# Patient Record
Sex: Female | Born: 1955
Health system: Southern US, Community
[De-identification: ages and names within clinical notes are randomized; demographics above are authoritative.]

## PROBLEM LIST (undated history)

## (undated) DIAGNOSIS — R002 Palpitations: Secondary | ICD-10-CM

## (undated) DIAGNOSIS — R079 Chest pain, unspecified: Secondary | ICD-10-CM

## (undated) DIAGNOSIS — J45909 Unspecified asthma, uncomplicated: Secondary | ICD-10-CM

## (undated) DIAGNOSIS — G4733 Obstructive sleep apnea (adult) (pediatric): Secondary | ICD-10-CM

## (undated) DIAGNOSIS — E559 Vitamin D deficiency, unspecified: Secondary | ICD-10-CM

## (undated) DIAGNOSIS — I219 Acute myocardial infarction, unspecified: Secondary | ICD-10-CM

## (undated) DIAGNOSIS — K5900 Constipation, unspecified: Secondary | ICD-10-CM

## (undated) DIAGNOSIS — F32A Depression, unspecified: Secondary | ICD-10-CM

## (undated) DIAGNOSIS — K219 Gastro-esophageal reflux disease without esophagitis: Secondary | ICD-10-CM

## (undated) DIAGNOSIS — N289 Disorder of kidney and ureter, unspecified: Secondary | ICD-10-CM

## (undated) DIAGNOSIS — E7800 Pure hypercholesterolemia, unspecified: Secondary | ICD-10-CM

## (undated) DIAGNOSIS — E2839 Other primary ovarian failure: Secondary | ICD-10-CM

## (undated) DIAGNOSIS — I2510 Atherosclerotic heart disease of native coronary artery without angina pectoris: Secondary | ICD-10-CM

## (undated) DIAGNOSIS — F419 Anxiety disorder, unspecified: Secondary | ICD-10-CM

## (undated) DIAGNOSIS — E119 Type 2 diabetes mellitus without complications: Secondary | ICD-10-CM

## (undated) DIAGNOSIS — D649 Anemia, unspecified: Secondary | ICD-10-CM

## (undated) DIAGNOSIS — I10 Essential (primary) hypertension: Secondary | ICD-10-CM

## (undated) DIAGNOSIS — K760 Fatty (change of) liver, not elsewhere classified: Secondary | ICD-10-CM

## (undated) HISTORY — PX: BREAST EXCISIONAL BIOPSY: SUR124

## (undated) HISTORY — DX: Obstructive sleep apnea (adult) (pediatric): G47.33

## (undated) HISTORY — DX: Constipation, unspecified: K59.00

## (undated) HISTORY — DX: Pure hypercholesterolemia, unspecified: E78.00

## (undated) HISTORY — DX: Fatty (change of) liver, not elsewhere classified: K76.0

## (undated) HISTORY — DX: Chest pain, unspecified: R07.9

## (undated) HISTORY — DX: Acute myocardial infarction, unspecified: I21.9

## (undated) HISTORY — DX: Palpitations: R00.2

## (undated) HISTORY — DX: Unspecified asthma, uncomplicated: J45.909

## (undated) HISTORY — DX: Morbid (severe) obesity due to excess calories: E66.01

## (undated) HISTORY — DX: Other primary ovarian failure: E28.39

## (undated) HISTORY — DX: Atherosclerotic heart disease of native coronary artery without angina pectoris: I25.10

## (undated) HISTORY — PX: OTHER SURGICAL HISTORY: SHX169

## (undated) HISTORY — DX: Depression, unspecified: F32.A

## (undated) HISTORY — DX: Essential (primary) hypertension: I10

## (undated) HISTORY — DX: Anemia, unspecified: D64.9

## (undated) HISTORY — DX: Type 2 diabetes mellitus without complications: E11.9

## (undated) HISTORY — DX: Gastro-esophageal reflux disease without esophagitis: K21.9

## (undated) HISTORY — DX: Anxiety disorder, unspecified: F41.9

## (undated) HISTORY — DX: Vitamin D deficiency, unspecified: E55.9

## (undated) HISTORY — DX: Disorder of kidney and ureter, unspecified: N28.9

---

## 2012-01-20 DIAGNOSIS — J45909 Unspecified asthma, uncomplicated: Secondary | ICD-10-CM | POA: Insufficient documentation

## 2017-07-31 DIAGNOSIS — F411 Generalized anxiety disorder: Secondary | ICD-10-CM | POA: Insufficient documentation

## 2017-07-31 DIAGNOSIS — F339 Major depressive disorder, recurrent, unspecified: Secondary | ICD-10-CM | POA: Insufficient documentation

## 2018-06-14 DIAGNOSIS — J309 Allergic rhinitis, unspecified: Secondary | ICD-10-CM | POA: Insufficient documentation

## 2018-06-14 DIAGNOSIS — K219 Gastro-esophageal reflux disease without esophagitis: Secondary | ICD-10-CM | POA: Insufficient documentation

## 2018-06-18 DIAGNOSIS — H903 Sensorineural hearing loss, bilateral: Secondary | ICD-10-CM | POA: Insufficient documentation

## 2019-08-07 ENCOUNTER — Ambulatory Visit: Payer: Self-pay | Admitting: Orthopaedic Surgery

## 2019-09-20 ENCOUNTER — Ambulatory Visit: Payer: BC Managed Care – PPO | Admitting: Orthopaedic Surgery

## 2019-09-20 ENCOUNTER — Encounter: Payer: Self-pay | Admitting: Orthopaedic Surgery

## 2019-09-20 ENCOUNTER — Other Ambulatory Visit: Payer: Self-pay

## 2019-09-20 ENCOUNTER — Ambulatory Visit: Payer: Self-pay

## 2019-09-20 VITALS — Ht 64.0 in | Wt 215.0 lb

## 2019-09-20 DIAGNOSIS — M19022 Primary osteoarthritis, left elbow: Secondary | ICD-10-CM | POA: Diagnosis not present

## 2019-09-20 DIAGNOSIS — S93602A Unspecified sprain of left foot, initial encounter: Secondary | ICD-10-CM

## 2019-09-20 DIAGNOSIS — M7661 Achilles tendinitis, right leg: Secondary | ICD-10-CM | POA: Diagnosis not present

## 2019-09-20 MED ORDER — DICLOFENAC SODIUM 1 % EX GEL: 2.0000 g | Freq: Four times a day (QID) | CUTANEOUS | 0 refills | Status: DC

## 2019-09-20 NOTE — Progress Notes (Signed)
Office Visit Note   Patient: Rachel Ayers           Date of Birth: 02-16-1956           MRN: [id number] Visit Date: 09/20/2019              Requested by: Darrow Bussing, MD [address] [place],  [place] [address] PCP: Darrow Bussing, MD   Assessment & Plan: Visit Diagnoses:  1. Achilles tendinitis, right leg   2. Arthritis of left elbow   3. Foot sprain, left, initial encounter     Plan: Impression is #1 left elbow contusion and posttraumatic arthritis.  #2 left midfoot sprain.  #3 right heel Haglund's deformity with Achilles tendinopathy.  In regards to the left elbow, we have discussed cortisone injection and possible Sterapred taper.  She is a diabetic and does not want to proceed with either.  We have agreed to try Voltaren gel in a sling for 1 week only.  We do not want her to get too stiff in this.  In regards to the left foot, she notes that she does not have any rigid soled shoes at home so we have provided her with a postoperative shoe to wear for comfort as needed.  In regards to the right heel, she does have a long cam walker at home and will go back in this for the next 4 to 6 weeks as well as use Voltaren gel and start physical therapy to try and settle it down.  If she does not have relief, she will follow back up with [place] and we will likely order an MRI to assess her Achilles tendon.  She currently does not want to proceed with surgery at this point in time.  Total face to face encounter time was greater than 45 minutes and over half of this time was spent in counseling and/or coordination of care.   Follow-Up Instructions: Return in about 6 weeks (around 11/01/2019).   Orders:  Orders Placed This Encounter  Procedures  . XR Elbow Complete Left (3+View)  . XR Os Calcis Right  . XR Foot Complete Left  . Ambulatory referral to Physical Therapy   Meds ordered this encounter  Medications  . diclofenac Sodium (VOLTAREN) 1 % GEL    Sig: Apply 2 g  topically 4 (four) times daily.    Dispense:  150 g    Refill:  0      Procedures: No procedures performed   Clinical Data: No additional findings.   Subjective: Chief Complaint  Patient presents with  . Right Ankle - Pain  . Left Elbow - Pain    DOI 09/18/2019    HPI patient is a 64 year old female who comes in today with multiple problems.  The first 1 is her left elbow.  She fell this past Wednesday after missing a step landing on her lateral elbow.  She had immediate pain and swelling which has slightly improved over the past 2 days.  She has slight pain with flexion of the elbow.  No numbness, tingling or burning.  Of note, she is status post radial head resection in 1985 following a significant fracture to the elbow.  Other issue she brings up today is her left foot.  She fell on [address] while chasing her dog.  She notes that her rug slipped out from under her feet and she is unsure exactly how her foot was injured.  Since then she has had pain to  the top of her foot worse over the fourth metatarsal.  Pain is worse with inversion of the foot as well as at night while trying to sleep.  Other issue is her right heel.  History of what sounds like Achilles tendinopathy for the past 2 to 3 years.  No specific injury there.  She has tried intermittently wearing a boot but not for any extensive period of time.  Her pain is worse with walking.  She does get mild relief wearing hiking boots.  Review of Systems as detailed in HPI.  All others reviewed and are negative.   Objective: Vital Signs: Ht 5\' 4"  (1.626 m)   Wt 215 lb (97.5 kg)   BMI 36.90 kg/m   Physical Exam well-developed well-nourished female in no acute distress.  Alert and oriented x3.  Ortho Exam examination of the left elbow reveals significant tenderness to the lateral aspect just over where the radial head should be.  Mild swelling and ecchymosis.  She has range of motion from about 15 to 125 degrees.  She notes that she has  never had full extension following the injury.  No pain with supination or pronation of the elbow.  Left foot exam shows moderate tenderness along the fourth metatarsal.  Increased pain with inversion of the ankle.  Right ankle exam shows a moderate tenderness over the distal Achilles.  Negative squeeze test.  She has dorsiflexion to neutral.  Negative Thompson's test.  Specialty Comments:  No specialty comments available.  Imaging: XR Elbow Complete Left (3+View)  Result Date: 09/20/2019 Radial head resection.  Moderate degenerative changes throughout.    XR Foot Complete Left  Result Date: 09/20/2019 No acute or structural abnormality  XR Os Calcis Right  Result Date: 09/20/2019 X-rays demonstrate a retrocalcaneal spur.  There is evidence of soft tissue swelling to the distal Achilles.    PMFS History: There are no problems to display for this patient.  History reviewed. No pertinent past medical history.  History reviewed. No pertinent family history.  History reviewed. No pertinent surgical history. Social History   Occupational History  . Not on file  Tobacco Use  . Smoking status: Not on file  Substance and Sexual Activity  . Alcohol use: Not on file  . Drug use: Not on file  . Sexual activity: Not on file

## 2019-09-26 ENCOUNTER — Ambulatory Visit: Payer: BC Managed Care – PPO | Admitting: Physical Therapy

## 2019-09-26 ENCOUNTER — Other Ambulatory Visit: Payer: Self-pay

## 2019-09-26 ENCOUNTER — Encounter: Payer: Self-pay | Admitting: Physical Therapy

## 2019-09-26 DIAGNOSIS — R262 Difficulty in walking, not elsewhere classified: Secondary | ICD-10-CM

## 2019-09-26 DIAGNOSIS — M6281 Muscle weakness (generalized): Secondary | ICD-10-CM | POA: Diagnosis not present

## 2019-09-26 DIAGNOSIS — M25571 Pain in right ankle and joints of right foot: Secondary | ICD-10-CM | POA: Diagnosis not present

## 2019-09-26 DIAGNOSIS — R2689 Other abnormalities of gait and mobility: Secondary | ICD-10-CM

## 2019-09-26 DIAGNOSIS — M25671 Stiffness of right ankle, not elsewhere classified: Secondary | ICD-10-CM

## 2019-09-26 NOTE — Therapy (Signed)
[facility] [address] [place], [place], [address] Phone: [phone]   Fax:  [phone]  Physical Therapy Evaluation  Patient Details  Name: Rachel Ayers MRN: [id number] Date of Birth: 12/31/1955 Referring Provider (PT): Gwendolyn Fill, [place]   Encounter Date: 09/26/2019  PT End of Session - 09/26/19 0948    Visit Number  1    Number of Visits  12    Date for PT Re-Evaluation  11/08/19    PT Start Time  0852    PT Stop Time  0935    PT Time Calculation (min)  43 min    Activity Tolerance  Patient tolerated treatment well    Behavior During Therapy  [facility] for tasks assessed/performed       History reviewed. No pertinent past medical history.  History reviewed. No pertinent surgical history.  There were no vitals filed for this visit.   Subjective Assessment - 09/26/19 0850    Subjective  Pt arriving to therapy with R achilles tendinitis that has been ongoing for years. Pt reporting no injections and no prior PT services. Pt reporting just moving to [facility] in [place] 2020. Pt reporting no pain at rest, but 7/10 pain with walking.    Pertinent History  left hip replacement 2018, HTN, DM, stage 3 kidney disease    Diagnostic tests  X-ray    Patient Stated Goals  Walk without pain    Currently in Pain?  Yes    Pain Score  7     Pain Location  Heel    Pain Orientation  Right    Pain Descriptors / Indicators  Aching;Burning    Pain Type  Chronic pain    Pain Radiating Towards  localized pain that can cause calf soreness    Pain Onset  More than a month ago    Pain Frequency  Intermittent    Aggravating Factors   walking, standing    Pain Relieving Factors  resting    Effect of Pain on Daily Activities  difficulty walking, and standing         [facility] PT Assessment - 09/26/19 0001      Assessment   Medical Diagnosis  R achilles tendonitis    Referring Provider (PT)  Gwendolyn Fill, PA-C    Onset Date/Surgical Date  --    years ago   Hand Dominance  Right    Prior Therapy  no      Precautions   Precautions  None    Precaution Comments  Pt reporting she has a CAM walker at home but doesn't wear it all the time due to inability to drive      Restrictions   Weight Bearing Restrictions  No      Balance Screen   Has the patient fallen in the past 6 months  Yes    How many times?  2, pt fell last Wednesday after missing a step pt with L ankle sprain and elbow injury    Has the patient had a decrease in activity level because of a fear of falling?   Yes    Is the patient reluctant to leave their home because of a fear of falling?   --   no     Cabin crew  Private residence    Living Arrangements  Other relatives    Available Help at Discharge  Family    Type of Home  House    Home Access  Level  entry      Cognition   Overall Cognitive Status  Within Functional Limits for tasks assessed      Posture/Postural Control   Posture/Postural Control  Postural limitations    Postural Limitations  Rounded Shoulders;Forward head;Decreased lumbar lordosis;Increased thoracic kyphosis    Posture Comments  --   scoliosis     ROM / Strength   AROM / PROM / Strength  AROM;Strength      AROM   Overall AROM   Deficits    AROM Assessment Site  Ankle    Right/Left Ankle  Right;Left    Right Ankle Dorsiflexion  -5    Right Ankle Plantar Flexion  40    Right Ankle Inversion  44    Right Ankle Eversion  38    Left Ankle Dorsiflexion  5    Left Ankle Plantar Flexion  20    Left Ankle Inversion  40    Left Ankle Eversion  30      Strength   Strength Assessment Site  Ankle    Right/Left Ankle  Right;Left    Right Ankle Dorsiflexion  3+/5    Right Ankle Plantar Flexion  3+/5    Right Ankle Inversion  3+/5    Right Ankle Eversion  3+/5    Left Ankle Dorsiflexion  4-/5    Left Ankle Plantar Flexion  4-/5    Left Ankle Inversion  4-/5    Left Ankle Eversion  4-/5      Palpation    Palpation comment  TTP R inferior achilles      Transfers   Five time sit to stand comments   18 seconds using UE support      Ambulation/Gait   Assistive device  None    Gait Pattern  Step-through pattern;Decreased dorsiflexion - right;Decreased dorsiflexion - left;Right foot flat;Left foot flat;Antalgic    Gait Comments  bilateral LE's ER with toe out                Objective measurements completed on examination: See above findings.      [facility] Adult PT Treatment/Exercise - 09/26/19 0001      Modalities   Modalities  Iontophoresis      Iontophoresis   Type of Iontophoresis  Dexamethasone    Location  R achilles tendon    Dose  4mg /65ml    Time  6 hour patch      Manual Therapy   Manual Therapy  Passive ROM    Passive ROM  R achilles             PT Education - 09/26/19 0941    Education Details  PT POC, HEP    Person(s) Educated  Patient    Methods  Explanation;Demonstration;Other (comment)    Comprehension  Verbalized understanding;Returned demonstration       PT Short Term Goals - 09/26/19 0956      PT SHORT TERM GOAL #1   Title  Perform BERG balance test and create LTG if appropriate.    Time  2    Period  Weeks    Target Date  11/08/19        PT Long Term Goals - 09/26/19 0957      PT LONG TERM GOAL #1   Title  Pt will be independent in her HEP and progression.    Time  6    Period  Weeks    Status  New    Target Date  11/07/19  PT LONG TERM GOAL #2   Title  Pt will be able to amb 30 minutes with pain </= 3/10 in R achilles.    Baseline  currently pain is 7/10 with walking short distances    Time  6    Period  Weeks    Status  New      PT LONG TERM GOAL #3   Title  Pt will improve her R DF to >/= 10 degrees to assit with normalized gait and heel strike.    Baseline  -5 degrees from neutral    Time  6    Period  Weeks    Status  New             Plan - 09/26/19 0949    Clinical Impression Statement  Pt arriving  to clinic reporting long history of R achilles tendonitis. Pt reporting 7/10 pain in R achilles with walking and no pain at rest. Pt reporting pain is worse in the morning and in late evening. Pt with decreased ROM noted in bilateral ankles. Pt with 5 degrees from neutral with R DF. Pt was edu in importance of stretching and mobility. Pt was issued a HEP and Ionto patch was placed. Pt was edu in Removal of patch after 6 hours. Skilled PT needed to progress pt toward  more functional mobilty.    Personal Factors and Comorbidities  Comorbidity 3+    Comorbidities  DM, R hip replacement, HTN, R rotator cuff injury, L elbow injury due to fall, recent history of 2 falls, L ankle sprain. kidney disease stage 3.    Examination-Activity Limitations  Squat;Stairs;Stand;Other    Examination-Participation Restrictions  Community Activity;Driving;Other    Stability/Clinical Decision Making  Stable/Uncomplicated    Clinical Decision Making  Low    Rehab Potential  Good    PT Frequency  2x / week    PT Duration  6 weeks    PT Treatment/Interventions  Cryotherapy;Electrical Stimulation;Iontophoresis 4mg /ml Dexamethasone;Moist Heat;Ultrasound;Gait training;Stair training;Functional mobility training;Neuromuscular re-education;Balance training;Therapeutic exercise;Therapeutic activities;Patient/family education;Orthotic Fit/Training;Taping;Dry needling;Manual techniques    PT Next Visit Plan  assess Iontophoresis, Perform BERG balance test, ankle stretching, strengthening, modalities as needed.    PT Home Exercise Plan  hamstirng stretch, heel cord stretch ( see pt instructions)    Consulted and Agree with Plan of Care  Patient       Patient will benefit from skilled therapeutic intervention in order to improve the following deficits and impairments:  Pain, Postural dysfunction, Decreased strength, Decreased activity tolerance, Decreased range of motion, Difficulty walking, Abnormal gait, Decreased balance  Visit  Diagnosis: Pain in right ankle and joints of right foot  Difficulty in walking, not elsewhere classified  Muscle weakness (generalized)  Other abnormalities of gait and mobility  Stiffness of right ankle, not elsewhere classified     Problem List There are no problems to display for this patient.   , MPT 09/26/2019, 10:00 AM  [facility] [address] [place], [place], [place] Phone: [phone]   Fax:  [phone]  Name: Rachel Ayers MRN: York Spaniel Date of Birth: 1956-01-01

## 2019-09-26 NOTE — Patient Instructions (Signed)
Access Code: [email or web address] URL: [email or web address] Date: 09/26/2019 Prepared by: Narda Amber  Exercises Standing Gastroc Stretch on Foam 1/2 Roll - 5-6 x daily - 7 x weekly - 3-5 reps - 30 seconds hold Hooklying Hamstring Stretch with Strap - 5-6 x daily - 7 x weekly - 3 reps - 30- seconds hold

## 2019-10-01 ENCOUNTER — Ambulatory Visit: Payer: BC Managed Care – PPO | Admitting: Rehabilitative and Restorative Service Providers"

## 2019-10-01 ENCOUNTER — Encounter: Payer: Self-pay | Admitting: Rehabilitative and Restorative Service Providers"

## 2019-10-01 DIAGNOSIS — M6281 Muscle weakness (generalized): Secondary | ICD-10-CM | POA: Diagnosis not present

## 2019-10-01 DIAGNOSIS — M25671 Stiffness of right ankle, not elsewhere classified: Secondary | ICD-10-CM

## 2019-10-01 DIAGNOSIS — R2689 Other abnormalities of gait and mobility: Secondary | ICD-10-CM

## 2019-10-01 DIAGNOSIS — M25571 Pain in right ankle and joints of right foot: Secondary | ICD-10-CM | POA: Diagnosis not present

## 2019-10-01 DIAGNOSIS — R262 Difficulty in walking, not elsewhere classified: Secondary | ICD-10-CM

## 2019-10-01 NOTE — Therapy (Signed)
[facility] [address] [place], [place], [address] Phone: [phone]   Fax:  [phone]  Physical Therapy Treatment  Patient Details  Name: Rachel Ayers MRN: [id number] Date of Birth: [birth year] Referring Provider (PT): Marrianne Mood, [place]   Encounter Date: 10/01/2019    History reviewed. No pertinent past medical history.  History reviewed. No pertinent surgical history.  There were no vitals filed for this visit.  Subjective Assessment - 10/01/19 1317    Subjective  Rachel Ayers reports inconsistency with her HEP.  I reminded her of the importance of consistent HEP compliance to meet long-term goals.    Pertinent History  left hip replacement 2018, HTN, DM, stage 3 kidney disease    Limitations  Walking;Standing    Diagnostic tests  X-ray    Patient Stated Goals  Walk without pain    Currently in Pain?  Yes    Pain Score  7     Pain Location  Ankle    Pain Orientation  Right    Pain Descriptors / Indicators  Burning;Sharp;Tightness    Pain Type  Chronic pain    Pain Onset  More than a month ago    Pain Frequency  Intermittent                       [facility] Adult PT Treatment/Exercise - 10/01/19 1018      Transfers   Five time sit to stand comments   --      Ambulation/Gait   Assistive device  None    Gait Pattern  --    Gait Comments  --      Posture/Postural Control   Posture/Postural Control  --    Postural Limitations  --    Posture Comments  --   scoliosis     Exercises   Exercises  Ankle      Modalities   Modalities  Iontophoresis      Iontophoresis   Type of Iontophoresis  Dexamethasone    Location  R achilles tendon    Dose  4mg /75ml    Time  6 hour patch      Manual Therapy   Manual Therapy  --    Passive ROM  --      Ankle Exercises: Stretches   Soleus Stretch  4 reps;20 seconds;Other (comment)   Slight toe in, back knee bent with straight line from should   Gastroc Stretch  4 reps;20  seconds;Other (comment)   Back knee straight with slight toe in   Slant Board Stretch  3 reps;60 seconds;Other (comment)   Knees straight (gastrocnemius) and bent (soleus) #X each or    Slant Board Stretch Limitations  --   Slight toe in requested for slant board     Ankle Exercises: Standing   Heel Raises  5 reps;3 seconds    Heel Raises Limitations  OK to shift more weight to L if neeed for comfort.   2 sets of 5 with emphasis on slow eccentrics.            PT Education - 10/01/19 1325    Education Details  Discussed the importance of avoiding overuse while still working on strength.    Person(s) Educated  Patient    Methods  Explanation    Comprehension  Verbalized understanding       PT Short Term Goals - 09/26/19 0956      PT SHORT TERM GOAL #1   Title  Perform BERG  balance test and create LTG if appropriate.    Time  2    Period  Weeks    Target Date  11/08/19        PT Long Term Goals - 09/26/19 0957      PT LONG TERM GOAL #1   Title  Pt will be independent in her HEP and progression.    Time  6    Period  Weeks    Status  New    Target Date  11/07/19      PT LONG TERM GOAL #2   Title  Pt will be able to amb 30 minutes with pain </= 3/10 in R achilles.    Baseline  currently pain is 7/10 with walking short distances    Time  6    Period  Weeks    Status  New      PT LONG TERM GOAL #3   Title  Pt will improve her R DF to >/= 10 degrees to assit with normalized gait and heel strike.    Baseline  -5 degrees from neutral    Time  6    Period  Weeks    Status  New            Plan - 10/01/19 1326    Clinical Impression Statement  Rachel Ayers will benefit from more consistent HEP compliance.  Upper and lower heel cords stretching (on and off slant board) and Achilles strengthening remains the focus while still avoiding overuse.       Patient will benefit from skilled therapeutic intervention in order to improve the following deficits and  impairments:     Visit Diagnosis: Pain in right ankle and joints of right foot  Difficulty in walking, not elsewhere classified  Muscle weakness (generalized)  Other abnormalities of gait and mobility  Stiffness of right ankle, not elsewhere classified     Problem List There are no problems to display for this patient.   Cathren Harsh MPT  10/01/2019, 1:34 PM  [facility] Physical Therapy [address] [place], [place], [address] Phone: [phone]   Fax:  [phone]  Name: Rachel Ayers MRN: [id number] Date of Birth: Jan 09, 1956

## 2019-10-03 ENCOUNTER — Ambulatory Visit: Payer: Self-pay

## 2019-10-03 ENCOUNTER — Encounter: Payer: Self-pay | Admitting: Orthopaedic Surgery

## 2019-10-03 ENCOUNTER — Ambulatory Visit: Payer: BC Managed Care – PPO | Admitting: Orthopaedic Surgery

## 2019-10-03 ENCOUNTER — Other Ambulatory Visit: Payer: Self-pay

## 2019-10-03 VITALS — Ht 64.0 in | Wt 215.0 lb

## 2019-10-03 DIAGNOSIS — M19022 Primary osteoarthritis, left elbow: Secondary | ICD-10-CM | POA: Diagnosis not present

## 2019-10-03 NOTE — Progress Notes (Signed)
   Office Visit Note   Patient: Rachel Ayers           Date of Birth: 17-Aug-1955           MRN: [id number] Visit Date: 10/03/2019              Requested by: Darrow Bussing, MD [address] [place],  [place] [address] PCP: Darrow Bussing, MD   Assessment & Plan: Visit Diagnoses:  1. Arthritis of left elbow     Plan: X-rays are stable.  In terms of her function and her range of motion and strength are actually incredibly good compared to what her x-rays demonstrate.  I recommend ice and heat and compression to the ecchymosis and the hematoma.  Patient instructed to follow-up if there are any signs or symptoms of infection.  Otherwise follow-up as needed.  Follow-Up Instructions: Return if symptoms worsen or fail to improve.   Orders:  Orders Placed This Encounter  Procedures  . XR Elbow 2 Views Left   No orders of the defined types were placed in this encounter.     Procedures: No procedures performed   Clinical Data: No additional findings.   Subjective: Chief Complaint  Patient presents with  . Left Elbow - Follow-up    [name] returns today for follow-up of her left elbow pain.  She still has the bruising and the subcutaneous hematoma.  No constitutional symptoms.   Review of Systems   Objective: Vital Signs: Ht 5\' 4"  (1.626 m)   Wt 215 lb (97.5 kg)   BMI 36.90 kg/m   Physical Exam  Ortho Exam Left elbow shows mild ecchymosis and a subcutaneous hematoma that does not appear to be infected.  She has excellent range of motion of the elbow Specialty Comments:  No specialty comments available.  Imaging: XR Elbow 2 Views Left  Result Date: 10/03/2019 Stable degenerative changes of the left elbow.  No acute abnormalities.    PMFS History: There are no problems to display for this patient.  History reviewed. No pertinent past medical history.  History reviewed. No pertinent family history.  History reviewed. No pertinent surgical  history. Social History   Occupational History  . Not on file  Tobacco Use  . Smoking status: Not on file  Substance and Sexual Activity  . Alcohol use: Not on file  . Drug use: Not on file  . Sexual activity: Not on file

## 2019-10-04 ENCOUNTER — Encounter: Payer: BC Managed Care – PPO | Admitting: Physical Therapy

## 2019-10-15 ENCOUNTER — Encounter: Payer: Self-pay | Admitting: Endocrinology

## 2019-10-15 ENCOUNTER — Ambulatory Visit: Payer: Medicare PPO | Admitting: Rehabilitative and Restorative Service Providers"

## 2019-10-15 ENCOUNTER — Other Ambulatory Visit: Payer: Self-pay

## 2019-10-15 ENCOUNTER — Encounter: Payer: Self-pay | Admitting: Rehabilitative and Restorative Service Providers"

## 2019-10-15 ENCOUNTER — Ambulatory Visit: Payer: Medicare PPO | Admitting: Endocrinology

## 2019-10-15 VITALS — BP 116/80 | HR 75 | Ht 64.0 in | Wt 212.0 lb

## 2019-10-15 DIAGNOSIS — E119 Type 2 diabetes mellitus without complications: Secondary | ICD-10-CM

## 2019-10-15 DIAGNOSIS — E1121 Type 2 diabetes mellitus with diabetic nephropathy: Secondary | ICD-10-CM

## 2019-10-15 DIAGNOSIS — N1831 Chronic kidney disease, stage 3a: Secondary | ICD-10-CM | POA: Diagnosis not present

## 2019-10-15 DIAGNOSIS — M25671 Stiffness of right ankle, not elsewhere classified: Secondary | ICD-10-CM

## 2019-10-15 DIAGNOSIS — M6281 Muscle weakness (generalized): Secondary | ICD-10-CM | POA: Diagnosis not present

## 2019-10-15 DIAGNOSIS — R262 Difficulty in walking, not elsewhere classified: Secondary | ICD-10-CM | POA: Diagnosis not present

## 2019-10-15 DIAGNOSIS — R2689 Other abnormalities of gait and mobility: Secondary | ICD-10-CM

## 2019-10-15 DIAGNOSIS — M25571 Pain in right ankle and joints of right foot: Secondary | ICD-10-CM

## 2019-10-15 DIAGNOSIS — E1122 Type 2 diabetes mellitus with diabetic chronic kidney disease: Secondary | ICD-10-CM

## 2019-10-15 LAB — POCT GLYCOSYLATED HEMOGLOBIN (HGB A1C): Hemoglobin A1C: 6.1 % — AB (ref 4.0–5.6)

## 2019-10-15 MED ORDER — RYBELSUS 3 MG PO TABS: 3.0000 mg | ORAL_TABLET | Freq: Every day | ORAL | 11 refills | Status: DC

## 2019-10-15 NOTE — Progress Notes (Signed)
Subjective:    Patient ID: Rachel Ayers, female    DOB: [birth year], 64 y.o.   MRN: [id number]  HPI pt is referred by Dr Docia Chuck, for diabetes.  Pt states DM was dx'ed in 2006; it is complicated by CAD and CRI; she has never been on insulin.  pt says her diet and exercise are good; she has never had GDM, pancreatitis, pancreatic surgery, severe hypoglycemia or DKA.  She did not tolerate Ozempic (diarrhea), or glimepiride (excessive appetite).  She tolerates Victoza well, but does not like taking a qd injection.  She has not recently taken metformin (CRI).  She would like to re-try the Ozempic.   No past medical history on file.  No past surgical history on file.  Social History   Socioeconomic History  . Marital status: Divorced    Spouse name: Not on file  . Number of children: Not on file  . Years of education: Not on file  . Highest education level: Not on file  Occupational History  . Not on file  Tobacco Use  . Smoking status: Never Smoker  . Smokeless tobacco: Never Used  Substance and Sexual Activity  . Alcohol use: Never  . Drug use: Never  . Sexual activity: Never  Other Topics Concern  . Not on file  Social History Narrative  . Not on file   Social Determinants of Health   Financial Resource Strain:   . Difficulty of Paying Living Expenses:   Food Insecurity:   . Worried About Programme researcher, broadcasting/film/video in the Last Year:   . Barista in the Last Year:   Transportation Needs:   . Freight forwarder (Medical):   Marland Kitchen Lack of Transportation (Non-Medical):   Physical Activity:   . Days of Exercise per Week:   . Minutes of Exercise per Session:   Stress:   . Feeling of Stress :   Social Connections:   . Frequency of Communication with Friends and Family:   . Frequency of Social Gatherings with Friends and Family:   . Attends Religious Services:   . Active Member of Clubs or Organizations:   . Attends Banker Meetings:   Marland Kitchen Marital Status:    Intimate Partner Violence:   . Fear of Current or Ex-Partner:   . Emotionally Abused:   Marland Kitchen Physically Abused:   . Sexually Abused:     Current Outpatient Medications on File Prior to Visit  Medication Sig Dispense Refill  . aspirin 325 MG tablet Take 325 mg by mouth daily.    Marland Kitchen buPROPion (WELLBUTRIN SR) 200 MG 12 hr tablet Take 200 mg by mouth daily.    Marland Kitchen escitalopram (LEXAPRO) 20 MG tablet Take 20 mg by mouth daily.    . hydrALAZINE (APRESOLINE) 25 MG tablet Take 25 mg by mouth 2 (two) times daily.    . Insulin Pen Needle (PEN NEEDLES) 32G X 4 MM MISC by Does not apply route.    . lansoprazole (PREVACID) 15 MG capsule Take 15 mg by mouth daily at 12 noon.    . liraglutide (VICTOZA) 18 MG/3ML SOPN Inject 0.6 mg into the skin daily.    Marland Kitchen METOPROLOL SUCCINATE ER PO Take 50 mg by mouth daily.    . MULTIPLE VITAMIN PO Take 1 tablet by mouth daily.    . simvastatin (ZOCOR) 40 MG tablet Take 40 mg by mouth daily.     No current facility-administered medications on file prior to visit.  Allergies  Allergen Reactions  . Sulfa Antibiotics     Family History  Problem Relation Age of Onset  . Diabetes Father     BP 116/80   Pulse 75   Ht 5\' 4"  (1.626 m)   Wt 212 lb (96.2 kg)   SpO2 94%   BMI 36.39 kg/m     Review of Systems denies blurry vision, chest pain, sob, n/v, and urinary frequency.  depression is well-controlled.  She has chronic weight gain.      Objective:   Physical Exam VS: see vs page GEN: no distress HEAD: head: no deformity eyes: no periorbital swelling, no proptosis external nose and ears are normal NECK: supple, thyroid is not enlarged CHEST WALL: no deformity LUNGS: clear to auscultation CV: reg rate and rhythm, no murmur MUSCULOSKELETAL: muscle bulk and strength are grossly normal.  no obvious joint swelling.  gait is normal and steady EXTEMITIES: no deformity.  no ulcer on the feet.  feet are of normal color and temp.  no edema PULSES: dorsalis  pedis intact bilat.  no carotid bruit NEURO:  cn 2-12 grossly intact.   readily moves all 4's.  sensation is intact to touch on the feet SKIN:  Normal texture and temperature.  No rash or suspicious lesion is visible.   NODES:  None palpable at the neck PSYCH: alert, well-oriented.  Does not appear anxious nor depressed.  Lab Results  Component Value Date   HGBA1C 6.1 (A) 10/15/2019   I have reviewed outside records, and summarized: Pt was noted to have elevated A1c, and referred here.  She was also seen by psychiatry for MDD, which was stable      Assessment & Plan:  Type 2 DM, with CRI: well-controlled.  She would like to change to oral rx.    Patient Instructions  good diet and exercise significantly improve the control of your diabetes.  please let me know if you wish to be referred to a dietician.  high blood sugar is very risky to your health.  you should see an eye doctor and dentist every year.  It is very important to get all recommended vaccinations.  Controlling your blood pressure and cholesterol drastically reduces the damage diabetes does to your body.  Those who smoke should quit.  Please discuss these with your doctor.  check your blood sugar once a day.  vary the time of day when you check, between before the 3 meals, and at bedtime.  also check if you have symptoms of your blood sugar being too high or too low.  please keep a record of the readings and bring it to your next appointment here (or you can bring the meter itself).  You can write it on any piece of paper.  please call us sooner if your blood sugar goes below 70, or if you have a lot of readings over 200. I have sent a prescription to your pharmacy, to change Vicotza to "Rybelsus."   We'll do the prior authorization if necessary.   Please come back for a follow-up appointment in 2 months.

## 2019-10-15 NOTE — Patient Instructions (Addendum)
good diet and exercise significantly improve the control of your diabetes.  please let me know if you wish to be referred to a dietician.  high blood sugar is very risky to your health.  you should see an eye doctor and dentist every year.  It is very important to get all recommended vaccinations.  Controlling your blood pressure and cholesterol drastically reduces the damage diabetes does to your body.  Those who smoke should quit.  Please discuss these with your doctor.  check your blood sugar once a day.  vary the time of day when you check, between before the 3 meals, and at bedtime.  also check if you have symptoms of your blood sugar being too high or too low.  please keep a record of the readings and bring it to your next appointment here (or you can bring the meter itself).  You can write it on any piece of paper.  please call us sooner if your blood sugar goes below 70, or if you have a lot of readings over 200. I have sent a prescription to your pharmacy, to change Vicotza to "Rybelsus."   We'll do the prior authorization if necessary.   Please come back for a follow-up appointment in 2 months.

## 2019-10-15 NOTE — Therapy (Signed)
[facility] Physical Therapy [address] [place], [place], [address] Phone: [phone]   Fax:  [phone]  Physical Therapy Treatment  Patient Details  Name: Rachel Ayers MRN: [id number] Date of Birth: 12/31/1955 Referring Provider (PT): Gwendolyn Fill, [place]   Encounter Date: 10/15/2019  PT End of Session - 10/15/19 1057    Visit Number  3    Number of Visits  12    Date for PT Re-Evaluation  11/08/19    Authorization Type  [employer]    PT Start Time  1015    PT Stop Time  1100    PT Time Calculation (min)  45 min    Activity Tolerance  No increased pain;Patient tolerated treatment well    Behavior During Therapy  [facility] for tasks assessed/performed       History reviewed. No pertinent past medical history.  History reviewed. No pertinent surgical history.  There were no vitals filed for this visit.  Subjective Assessment - 10/15/19 1018    Subjective  Good compliance with heel raises.  Needs more consistency with the stretching.    Pertinent History  left hip replacement 2018, HTN, DM, stage 3 kidney disease    Limitations  Walking;Standing    Diagnostic tests  X-ray    Patient Stated Goals  Walk without pain    Currently in Pain?  Yes    Pain Score  1     Pain Location  Ankle    Pain Orientation  Right    Pain Descriptors / Indicators  Sharp;Tightness    Pain Type  Chronic pain    Pain Radiating Towards  Achilles    Pain Onset  More than a month ago    Pain Frequency  Intermittent    Aggravating Factors   Weight-bearing (walking)    Pain Relieving Factors  Rest    Effect of Pain on Daily Activities  Limits how much she can stand and walk                       Rachel Ayers Adult PT Treatment/Exercise - 10/15/19 0001      Exercises   Exercises  Ankle      Modalities   Modalities  Iontophoresis      Iontophoresis   Type of Iontophoresis  Dexamethasone    Location  R achilles tendon    Dose  4mg /19ml    Time  6 hour patch      Ankle Exercises: Stretches   Soleus Stretch  4 reps;20 seconds;Other (comment)   Slight toe in, back knee bent with straight line from should   Gastroc Stretch  4 reps;20 seconds;Other (comment)   Back knee straight with slight toe in   Slant Board Stretch  3 reps;60 seconds;Other (comment)   Knees straight (gastrocnemius) and bent (soleus) #X each or    Slant Board Stretch Limitations  --   Slight toe in requested for slant board     Ankle Exercises: Aerobic   Stationary Bike  Seat 5, Level 4, 8 minutes      Ankle Exercises: Machines for Strengthening   Cybex Leg Press  Calf raises 3 sets of 10 50#      Ankle Exercises: Standing   Heel Raises  5 reps;3 seconds   3 sets    Heel Raises Limitations  OK to shift more weight to L if neeed for comfort.   2 sets of 5 with emphasis on slow eccentrics.  PT Education - 10/15/19 1022    Education Details  Discussed the importance of HEP compliance.    Person(s) Educated  Patient    Methods  Explanation;Verbal cues;Handout    Comprehension  Verbalized understanding;Returned demonstration;Verbal cues required;Need further instruction       PT Short Term Goals - 09/26/19 0956      PT SHORT TERM GOAL #1   Title  Perform BERG balance test and create LTG if appropriate.    Time  2    Period  Weeks    Target Date  11/08/19        PT Long Term Goals - 09/26/19 0957      PT LONG TERM GOAL #1   Title  Pt will be independent in her HEP and progression.    Time  6    Period  Weeks    Status  New    Target Date  11/07/19      PT LONG TERM GOAL #2   Title  Pt will be able to amb 30 minutes with pain </= 3/10 in R achilles.    Baseline  currently pain is 7/10 with walking short distances    Time  6    Period  Weeks    Status  New      PT LONG TERM GOAL #3   Title  Pt will improve her R DF to >/= 10 degrees to assit with normalized gait and heel strike.    Baseline  -5 degrees from neutral    Time  6    Period   Weeks    Status  New            Plan - 10/15/19 1059    Clinical Impression Statement  Continue to push for better HEP compliance.  Tight heel cords (gastroc and soleus) and weak plantar flexors will benefit from continued skilled PT to improve standing and walking endurance and function.    Personal Factors and Comorbidities  Comorbidity 3+    Comorbidities  DM, R hip replacement, HTN, R rotator cuff injury, L elbow injury due to fall, recent history of 2 falls, L ankle sprain. kidney disease stage 3.    Examination-Activity Limitations  Squat;Stairs;Stand;Other    Examination-Participation Restrictions  Community Activity;Driving;Other    Stability/Clinical Decision Making  Stable/Uncomplicated    Rehab Potential  Good    PT Frequency  2x / week    PT Duration  6 weeks    PT Treatment/Interventions  Cryotherapy;Electrical Stimulation;Iontophoresis 4mg /ml Dexamethasone;Moist Heat;Ultrasound;Gait training;Stair training;Functional mobility training;Neuromuscular re-education;Balance training;Therapeutic exercise;Therapeutic activities;Patient/family education;Orthotic Fit/Training;Taping;Dry needling;Manual techniques    PT Next Visit Plan  Continue current plan.    PT Home Exercise Plan  Upper and lower heels cords stretching along with calf muscle strengthening.    Consulted and Agree with Plan of Care  Patient       Patient will benefit from skilled therapeutic intervention in order to improve the following deficits and impairments:  Pain, Postural dysfunction, Decreased strength, Decreased activity tolerance, Decreased range of motion, Difficulty walking, Abnormal gait, Decreased balance  Visit Diagnosis: Pain in right ankle and joints of right foot  Difficulty in walking, not elsewhere classified  Muscle weakness (generalized)  Other abnormalities of gait and mobility  Stiffness of right ankle, not elsewhere classified     Problem List There are no problems to display  for this patient.   Farley Ly PT, MPT 10/15/2019, 1:20 PM  [facility] Physical Therapy [phone]  [address] [place], [place], [address] Phone: [phone]   Fax:  [phone]  Name: Rachel Ayers MRN: [id number] Date of Birth: 1956/02/05

## 2019-10-15 NOTE — Patient Instructions (Signed)
Access Code: [email or web address] URL: [email or web address] Date: 10/15/2019 Prepared by: Pauletta Browns  Exercises Standing Gastroc Stretch - 2-3 x daily - 7 x weekly - 1 sets - 5 reps - 20 hold Standing Soleus Stretch - 1 x daily - 7 x weekly - 1 sets - 5 reps - 20 hold Standing Heel Raise - 10 x daily - 7 x weekly - 1 sets - 5 reps - 3 seconds hold

## 2019-10-18 ENCOUNTER — Encounter: Payer: Self-pay | Admitting: Rehabilitative and Restorative Service Providers"

## 2019-10-18 ENCOUNTER — Ambulatory Visit: Payer: Medicare PPO | Admitting: Rehabilitative and Restorative Service Providers"

## 2019-10-18 ENCOUNTER — Other Ambulatory Visit: Payer: Self-pay

## 2019-10-18 DIAGNOSIS — R2689 Other abnormalities of gait and mobility: Secondary | ICD-10-CM

## 2019-10-18 DIAGNOSIS — M25571 Pain in right ankle and joints of right foot: Secondary | ICD-10-CM | POA: Diagnosis not present

## 2019-10-18 DIAGNOSIS — M6281 Muscle weakness (generalized): Secondary | ICD-10-CM | POA: Diagnosis not present

## 2019-10-18 DIAGNOSIS — I10 Essential (primary) hypertension: Secondary | ICD-10-CM | POA: Insufficient documentation

## 2019-10-18 DIAGNOSIS — E119 Type 2 diabetes mellitus without complications: Secondary | ICD-10-CM | POA: Insufficient documentation

## 2019-10-18 DIAGNOSIS — R262 Difficulty in walking, not elsewhere classified: Secondary | ICD-10-CM

## 2019-10-18 DIAGNOSIS — M25671 Stiffness of right ankle, not elsewhere classified: Secondary | ICD-10-CM

## 2019-10-18 NOTE — Therapy (Signed)
[facility] Physical Therapy [address] [place], [place], [address] Phone: [phone]   Fax:  [phone]  Physical Therapy Treatment  Patient Details  Name: Rachel Ayers MRN: [id number] Date of Birth: 07/16/1955 Referring Provider (PT): Gwendolyn Fill, [place]   Encounter Date: 10/18/2019  PT End of Session - 10/18/19 1015    Visit Number  4    Number of Visits  12    Date for PT Re-Evaluation  11/08/19    Authorization Type  [employer]    Progress Note Due on Visit  10    PT Start Time  1015    PT Stop Time  1055    PT Time Calculation (min)  40 min    Activity Tolerance  No increased pain;Patient tolerated treatment well    Behavior During Therapy  [facility] for tasks assessed/performed       History reviewed. No pertinent past medical history.  History reviewed. No pertinent surgical history.  There were no vitals filed for this visit.  Subjective Assessment - 10/18/19 1017    Subjective  Pt. stated feeling soreness at 2/10, increased c walking.    Pertinent History  left hip replacement 2018, HTN, DM, stage 3 kidney disease    Limitations  Walking;Standing    Diagnostic tests  X-ray    Patient Stated Goals  Walk without pain    Currently in Pain?  Yes    Pain Score  2     Pain Location  Ankle    Pain Orientation  Right    Pain Descriptors / Indicators  Tightness;Sore    Pain Onset  More than a month ago    Pain Frequency  Intermittent    Aggravating Factors   walking                       [facility] Adult PT Treatment/Exercise - 10/18/19 0001      Iontophoresis   Type of Iontophoresis  Dexamethasone    Location  R achilles tendon    Dose  4mg /43ml    Time  6 hour patch      Manual Therapy   Manual therapy comments  g3-g4 ap talocrural jt mobs Rt ankle, active compression to medial gastroc      Ankle Exercises: Standing   Heel Raises  Right   3 x 10 eccentric c HHA on bar     Ankle Exercises: Stretches   Gastroc Stretch  5  reps;30 seconds;Other (comment)   Rt LE posterior on incline board   Other Stretch  DF on incline board Rt LE 2 x 10      Ankle Exercises: Aerobic   Stationary Bike  lvl 3 5 mins               PT Short Term Goals - 09/26/19 0956      PT SHORT TERM GOAL #1   Title  Perform BERG balance test and create LTG if appropriate.    Time  2    Period  Weeks    Target Date  11/08/19        PT Long Term Goals - 10/18/19 1037      PT LONG TERM GOAL #1   Title  Pt will be independent in her HEP and progression.    Time  6    Period  Weeks    Status  On-going      PT LONG TERM GOAL #2   Title  Pt  will be able to amb 30 minutes with pain </= 3/10 in R achilles.    Baseline  currently pain is 7/10 with walking short distances    Time  6    Period  Weeks    Status  On-going      PT LONG TERM GOAL #3   Title  Pt will improve her R DF to >/= 10 degrees to assit with normalized gait and heel strike.    Baseline  -5 degrees from neutral    Time  6    Period  Weeks    Status  On-going            Plan - 10/18/19 1040    Clinical Impression Statement  Eccentric loading indicated and progressed to SL loading in PF.  trigger points in medial gastroc evident and possiblity related to maladaptive loading in achilles chronically.    Personal Factors and Comorbidities  Comorbidity 3+    Comorbidities  DM, R hip replacement, HTN, R rotator cuff injury, L elbow injury due to fall, recent history of 2 falls, L ankle sprain. kidney disease stage 3.    Examination-Activity Limitations  Squat;Stairs;Stand;Other    Examination-Participation Restrictions  Community Activity;Driving;Other    Stability/Clinical Decision Making  Stable/Uncomplicated    Rehab Potential  Good    PT Frequency  2x / week    PT Duration  6 weeks    PT Treatment/Interventions  Cryotherapy;Electrical Stimulation;Iontophoresis 4mg /ml Dexamethasone;Moist Heat;Ultrasound;Gait training;Stair training;Functional  mobility training;Neuromuscular re-education;Balance training;Therapeutic exercise;Therapeutic activities;Patient/family education;Orthotic Fit/Training;Taping;Dry needling;Manual techniques    PT Next Visit Plan  Reassess Eccentric PF activity    Consulted and Agree with Plan of Care  Patient       Patient will benefit from skilled therapeutic intervention in order to improve the following deficits and impairments:  Pain, Postural dysfunction, Decreased strength, Decreased activity tolerance, Decreased range of motion, Difficulty walking, Abnormal gait, Decreased balance  Visit Diagnosis: Pain in right ankle and joints of right foot  Difficulty in walking, not elsewhere classified  Muscle weakness (generalized)  Other abnormalities of gait and mobility  Stiffness of right ankle, not elsewhere classified     Scot Jun, PT, DPT, OCS, ATC 10/18/19  11:01 AM    [facility] Physical Therapy [address] [place], [place], [address] Phone: [phone]   Fax:  [phone]  Name: Rachel Ayers MRN: [id number] Date of Birth: 05/05/1956

## 2019-10-22 ENCOUNTER — Other Ambulatory Visit: Payer: Self-pay

## 2019-10-22 ENCOUNTER — Ambulatory Visit: Payer: Medicare PPO | Admitting: Physical Therapy

## 2019-10-22 ENCOUNTER — Encounter: Payer: Self-pay | Admitting: Physical Therapy

## 2019-10-22 DIAGNOSIS — R2689 Other abnormalities of gait and mobility: Secondary | ICD-10-CM

## 2019-10-22 DIAGNOSIS — M25671 Stiffness of right ankle, not elsewhere classified: Secondary | ICD-10-CM

## 2019-10-22 DIAGNOSIS — M25571 Pain in right ankle and joints of right foot: Secondary | ICD-10-CM | POA: Diagnosis not present

## 2019-10-22 DIAGNOSIS — M6281 Muscle weakness (generalized): Secondary | ICD-10-CM | POA: Diagnosis not present

## 2019-10-22 DIAGNOSIS — R262 Difficulty in walking, not elsewhere classified: Secondary | ICD-10-CM

## 2019-10-22 NOTE — Therapy (Signed)
[facility] Physical Therapy [address] [place], [place], [address] Phone: [phone]   Fax:  [phone]  Physical Therapy Treatment  Patient Details  Name: Rachel Ayers MRN: [id number] Date of Birth: 03/24/1956 Referring Provider (PT): Gwendolyn Fill, [place]   Encounter Date: 10/22/2019  PT End of Session - 10/22/19 1247    Visit Number  5    Number of Visits  12    Date for PT Re-Evaluation  11/08/19    Authorization Type  [employer]    Progress Note Due on Visit  10    PT Start Time  1100    PT Stop Time  1145    PT Time Calculation (min)  45 min    Activity Tolerance  No increased pain;Patient tolerated treatment well    Behavior During Therapy  [facility] for tasks assessed/performed       History reviewed. No pertinent past medical history.  History reviewed. No pertinent surgical history.  There were no vitals filed for this visit.  Subjective Assessment - 10/22/19 1108    Subjective  Pt arriving to therapy reporting 3/10 pain in her R achilles. Pt reported wearing her hiking boots over the weekend. Pt reporting they are the only shoes that seem to help her pain.    Pertinent History  left hip replacement 2018, HTN, DM, stage 3 kidney disease    Limitations  Walking;Standing    Diagnostic tests  X-ray    Patient Stated Goals  Walk without pain    Currently in Pain?  Yes    Pain Score  2     Pain Location  Ankle    Pain Orientation  Right    Pain Descriptors / Indicators  Sore    Pain Type  Chronic pain    Pain Onset  More than a month ago                       [facility] Adult PT Treatment/Exercise - 10/22/19 0001      Exercises   Exercises  Ankle      Modalities   Modalities  Iontophoresis      Iontophoresis   Type of Iontophoresis  Dexamethasone    Location  R achilles tendon    Dose  4mg /52ml    Time  6 hour patch      Manual Therapy   Manual Therapy  Soft tissue mobilization    Manual therapy comments  Grade 3-4  talocrural joint mobs on Right    Soft tissue mobilization  IASTM to achilles on right    Passive ROM  DF, inversion/eversion      Ankle Exercises: Stretches   Gastroc Stretch  5 reps;30 seconds;Other (comment)   Rt LE posterior on incline board     Ankle Exercises: Aerobic   Stationary Bike  L3 x 5 mins      Ankle Exercises: Standing   Heel Raises  Right   3 x 10 eccentric c HHA on bar     Ankle Exercises: Seated   Other Seated Ankle Exercises  4 way ankle exercises using Level 1 theraband 2 x 10                PT Short Term Goals - 09/26/19 0956      PT SHORT TERM GOAL #1   Title  Perform BERG balance test and create LTG if appropriate.    Time  2    Period  Weeks  Target Date  11/08/19        PT Long Term Goals - 10/18/19 1037      PT LONG TERM GOAL #1   Title  Pt will be independent in her HEP and progression.    Time  6    Period  Weeks    Status  On-going      PT LONG TERM GOAL #2   Title  Pt will be able to amb 30 minutes with pain </= 3/10 in R achilles.    Baseline  currently pain is 7/10 with walking short distances    Time  6    Period  Weeks    Status  On-going      PT LONG TERM GOAL #3   Title  Pt will improve her R DF to >/= 10 degrees to assit with normalized gait and heel strike.    Baseline  -5 degrees from neutral    Time  6    Period  Weeks    Status  On-going            Plan - 10/22/19 1249    Clinical Impression Statement  Pt tolerating exercises well, Pt with tenderness noted at distal achilles tendon. Pt tolerating IASTM to gastroc and achilles, Ionto 6 hour patch applied. We added 4 way ankle exercises and encouraged pt to continue stretching at home.    Personal Factors and Comorbidities  Comorbidity 3+    Comorbidities  DM, R hip replacement, HTN, R rotator cuff injury, L elbow injury due to fall, recent history of 2 falls, L ankle sprain. kidney disease stage 3.    Examination-Activity Limitations   Squat;Stairs;Stand;Other    Examination-Participation Restrictions  Community Activity;Driving;Other    Stability/Clinical Decision Making  Stable/Uncomplicated    Rehab Potential  Good    PT Frequency  2x / week    PT Duration  6 weeks    PT Treatment/Interventions  Cryotherapy;Electrical Stimulation;Iontophoresis 4mg /ml Dexamethasone;Moist Heat;Ultrasound;Gait training;Stair training;Functional mobility training;Neuromuscular re-education;Balance training;Therapeutic exercise;Therapeutic activities;Patient/family education;Orthotic Fit/Training;Taping;Dry needling;Manual techniques    PT Next Visit Plan  Reassess Eccentric PF activity    PT Home Exercise Plan  Upper and lower heels cords stretching along with calf muscle strengthening.    Consulted and Agree with Plan of Care  Patient       Patient will benefit from skilled therapeutic intervention in order to improve the following deficits and impairments:  Pain, Postural dysfunction, Decreased strength, Decreased activity tolerance, Decreased range of motion, Difficulty walking, Abnormal gait, Decreased balance  Visit Diagnosis: Pain in right ankle and joints of right foot  Difficulty in walking, not elsewhere classified  Muscle weakness (generalized)  Other abnormalities of gait and mobility  Stiffness of right ankle, not elsewhere classified     Problem List Patient Active Problem List   Diagnosis Date Noted  . Diabetes ([place]) 10/18/2019    Oretha Caprice, PT, MPT 10/22/2019, 12:52 PM  [facility] Physical Therapy [address] [place], [place], [address] Phone: [phone]   Fax:  [phone]  Name: Rachel Ayers MRN: [id number] Date of Birth: [birth year]

## 2019-10-25 ENCOUNTER — Other Ambulatory Visit: Payer: Self-pay

## 2019-10-25 ENCOUNTER — Encounter: Payer: Self-pay | Admitting: Physical Therapy

## 2019-10-25 ENCOUNTER — Ambulatory Visit: Payer: Medicare PPO | Admitting: Physical Therapy

## 2019-10-25 DIAGNOSIS — R2689 Other abnormalities of gait and mobility: Secondary | ICD-10-CM | POA: Diagnosis not present

## 2019-10-25 DIAGNOSIS — M6281 Muscle weakness (generalized): Secondary | ICD-10-CM

## 2019-10-25 DIAGNOSIS — M25571 Pain in right ankle and joints of right foot: Secondary | ICD-10-CM | POA: Diagnosis not present

## 2019-10-25 DIAGNOSIS — R262 Difficulty in walking, not elsewhere classified: Secondary | ICD-10-CM | POA: Diagnosis not present

## 2019-10-25 DIAGNOSIS — M25671 Stiffness of right ankle, not elsewhere classified: Secondary | ICD-10-CM

## 2019-10-25 NOTE — Therapy (Addendum)
[facility] Physical Therapy [address] [place], [place], [address] Phone: [phone]   Fax:  [phone]  Physical Therapy Treatment  Patient Details  Name: Rachel Ayers MRN: [id number] Date of Birth: 1955/07/07 Referring Provider (PT): Marrianne Mood, [place]   Encounter Date: 10/25/2019  PT End of Session - 10/25/19 1131    Visit Number  6    Number of Visits  12    Date for PT Re-Evaluation  11/08/19    Authorization Type  [employer], submitted authorization on  10/25/2019 to 11/08/2019, will need to re-submit when re-eval is performed    Progress Note Due on Visit  10    PT Start Time  0930    PT Stop Time  1014    PT Time Calculation (min)  44 min       History reviewed. No pertinent past medical history.  History reviewed. No pertinent surgical history.  There were no vitals filed for this visit.  Subjective Assessment - 10/25/19 1126    Subjective  Pt arriving to therapy reporting 2-3/10  pain in her R achilles. Pt reporting more soreness. Pt did state she feels like she is making progress.    Pertinent History  left hip replacement 2018, HTN, DM, stage 3 kidney disease    Limitations  Walking;Standing    Diagnostic tests  X-ray    Patient Stated Goals  Walk without pain    Currently in Pain?  Yes    Pain Score  3     Pain Location  Ankle    Pain Orientation  Right    Pain Descriptors / Indicators  Sore    Pain Type  Chronic pain    Pain Onset  More than a month ago                        [facility] Adult PT Treatment/Exercise - 10/25/19 0001      Exercises   Exercises  Ankle      Modalities   Modalities  Iontophoresis      Iontophoresis   Type of Iontophoresis  Dexamethasone    Location  R achilles tendon    Dose  4mg /54ml    Time  6 hour patch      Manual Therapy   Manual therapy comments  Grade 3-4 talocrural joint mobs on Right    Passive ROM  DF, inversion/eversion      Ankle Exercises: Stretches   Gastroc  Stretch  5 reps;30 seconds;Other (comment)   Rt LE posterior on incline board     Ankle Exercises: Aerobic   Stationary Bike  L3 x 5 mins      Ankle Exercises: Standing   Heel Raises  Both;15 reps    Heel Raises Limitations  workiong on eccentric control    Toe Raise  15 reps;3 seconds    Other Standing Ankle Exercises  Standing balance on Airex      Ankle Exercises: Seated   Other Seated Ankle Exercises  4 way ankle exercises using Level 2 theraband 2 x 10              PT Education - 10/25/19 1131    Education Details  Removal of tape instructions and ionto patch    Person(s) Educated  Patient    Methods  Explanation    Comprehension  Verbalized understanding       PT Short Term Goals - 09/26/19 0956      PT  SHORT TERM GOAL #1   Title  Perform BERG balance test and create LTG if appropriate.    Time  2    Period  Weeks    Target Date  11/08/19        PT Long Term Goals - 10/25/19 1158      PT LONG TERM GOAL #1   Title  Pt will be independent in her HEP and progression.    Time  6    Period  Weeks    Status  On-going      PT LONG TERM GOAL #2   Title  Pt will be able to amb 30 minutes with pain </= 3/10 in R achilles.    Baseline  currently pain is 7/10 with walking short distances    Time  6    Period  Weeks    Status  On-going      PT LONG TERM GOAL #3   Title  Pt will improve her R DF to >/= 10 degrees to assit with normalized gait and heel strike.    Baseline  -5 degrees from neutral    Time  6    Period  Weeks    Status  On-going            Plan - 10/25/19 1134    Clinical Impression Statement  Pt tolerating treatment well, progressing with ankle strengthening exercises using red theraband. Ionto 6 hour patch applied along with achilles kinesotaping to bilateral achilles. Continue to work on achilles taping and mobility.    Personal Factors and Comorbidities  Comorbidity 3+    Comorbidities  DM, R hip replacement, HTN, R rotator cuff  injury, L elbow injury due to fall, recent history of 2 falls, L ankle sprain. kidney disease stage 3.    Examination-Activity Limitations  Squat;Stairs;Stand;Other    Examination-Participation Restrictions  Community Activity;Driving;Other    Stability/Clinical Decision Making  Stable/Uncomplicated    Rehab Potential  Good    PT Frequency  2x / week    PT Duration  6 weeks    PT Treatment/Interventions  Cryotherapy;Electrical Stimulation;Iontophoresis 4mg /ml Dexamethasone;Moist Heat;Ultrasound;Gait training;Stair training;Functional mobility training;Neuromuscular re-education;Balance training;Therapeutic exercise;Therapeutic activities;Patient/family education;Orthotic Fit/Training;Taping;Dry needling;Manual techniques    PT Next Visit Plan  Reassess Eccentric PF activity    PT Home Exercise Plan  Upper and lower heels cords stretching along with calf muscle strengthening.    Consulted and Agree with Plan of Care  Patient       Patient will benefit from skilled therapeutic intervention in order to improve the following deficits and impairments:  Pain, Postural dysfunction, Decreased strength, Decreased activity tolerance, Decreased range of motion, Difficulty walking, Abnormal gait, Decreased balance  Visit Diagnosis: Pain in right ankle and joints of right foot  Difficulty in walking, not elsewhere classified  Muscle weakness (generalized)  Other abnormalities of gait and mobility  Stiffness of right ankle, not elsewhere classified     Problem List Patient Active Problem List   Diagnosis Date Noted  . Diabetes (HCC) 10/18/2019    12/18/2019, PT, MPT 10/25/2019, 12:11 PM  [facility] Physical Therapy [address] [place], [place], [place] Phone: [phone]   Fax:  [phone]  Name: Rachel Ayers MRN: [name] Date of Birth: 03-13-1956

## 2019-10-29 ENCOUNTER — Encounter: Payer: Medicare PPO | Admitting: Rehabilitative and Restorative Service Providers"

## 2019-10-30 ENCOUNTER — Other Ambulatory Visit: Payer: Self-pay

## 2019-10-30 ENCOUNTER — Telehealth: Payer: Self-pay | Admitting: Endocrinology

## 2019-10-30 DIAGNOSIS — N1831 Chronic kidney disease, stage 3a: Secondary | ICD-10-CM

## 2019-10-30 MED ORDER — ONETOUCH ULTRA 2 W/DEVICE KIT: 1.0000 | PACK | Freq: Every day | 0 refills | Status: DC

## 2019-10-30 MED ORDER — RYBELSUS 7 MG PO TABS: 7.0000 mg | ORAL_TABLET | Freq: Every day | ORAL | 3 refills | Status: DC

## 2019-10-30 NOTE — Telephone Encounter (Signed)
Patient requests a new RX for a One Touch Ultra Meter (patient's current meter broke) be sent to:  [id number] [place], [place] - [address] Phone:  [phone]  Fax:  [phone]     Patient is leaving town tomorrow afternoon and would appreciate the above RX be sent asap.  Also, patient states she may need to increase the dosage of Rybelsus from 3 mg to 6 mg. Currently patient states the 3 mg dosage is not doing what it is supposed to do. Patient requests to be called at ph# [phone] to be advised about Rybelsus dosage increase.

## 2019-10-30 NOTE — Telephone Encounter (Signed)
Outpatient Medication Detail   Disp Refills Start End   Semaglutide (RYBELSUS) 7 MG TABS 90 tablet 3 10/30/2019    Sig - Route: Take 7 mg by mouth daily. - Oral   Sent to pharmacy as: Semaglutide (RYBELSUS) 7 MG Tab   E-Prescribing Status: Receipt confirmed by pharmacy (10/30/2019  4:25 PM EDT)    Called pt to inform about Dr. George Hugh new orders above and recommendation for 3mg  Rybelsus as well. LVM requesting returned call.

## 2019-10-30 NOTE — Telephone Encounter (Signed)
I have sent a prescription to your pharmacy, to increase the Rybelsus.  The next higher amount is 7 mg.  You can use up the 3 mg by taking 2 per day.

## 2019-10-30 NOTE — Telephone Encounter (Signed)
Please advise about Rybelsus -  Also, patient states she may need to increase the dosage of Rybelsus from 3 mg to 6 mg. Currently patient states the 3 mg dosage is not doing what it is supposed to do. Patient requests to be called at ph# [phone] to be advised about Rybelsus dosage increase.  Outpatient Medication Detail   Disp Refills Start End   Blood Glucose Monitoring Suppl (ONE TOUCH ULTRA 2) w/Device KIT 1 kit 0 10/30/2019    Sig - Route: 1 each by Does not apply route daily. E11.9 - Does not apply   Sent to pharmacy as: Blood Glucose Monitoring Suppl (ONE TOUCH ULTRA 2) w/Device Kit   E-Prescribing Status: Receipt confirmed by pharmacy (10/30/2019  2:56 PM EDT)    Pharmacy  [id number] [place] [id number] Phone:  [phone] Fax:  [phone]   NOTE - When placing order, indicated this is not covered by pt plan. Will await response from pharmacy to determine if claim denied. If so, will need to provide sample meter to pt.

## 2019-10-31 ENCOUNTER — Other Ambulatory Visit: Payer: Self-pay

## 2019-10-31 DIAGNOSIS — E1122 Type 2 diabetes mellitus with diabetic chronic kidney disease: Secondary | ICD-10-CM

## 2019-10-31 DIAGNOSIS — N1831 Chronic kidney disease, stage 3a: Secondary | ICD-10-CM

## 2019-10-31 MED ORDER — ACCU-CHEK SOFTCLIX LANCETS MISC: 1.0000 | Freq: Every day | 2 refills | Status: DC

## 2019-10-31 MED ORDER — ACCU-CHEK GUIDE VI STRP: 1.0000 | ORAL_STRIP | Freq: Every day | 2 refills | Status: DC

## 2019-10-31 MED ORDER — ACCU-CHEK GUIDE ME W/DEVICE KIT: 1.0000 | PACK | Freq: Every day | 0 refills | Status: DC

## 2019-10-31 NOTE — Telephone Encounter (Signed)
Called pt and informed about new Rybelsus orders and additional instructions as written below by Dr. Everardo All. Verbalized acceptance and understanding. Pt also indicated Onetouch is not covered by her insurance and requested to change her Rx to Accu Chek Guide. All Rx's for meter, strips and lancets sent to pharmacy per pt request.

## 2019-11-07 ENCOUNTER — Encounter: Payer: Medicare PPO | Admitting: Physical Therapy

## 2019-11-07 ENCOUNTER — Telehealth: Payer: Self-pay | Admitting: Physical Therapy

## 2019-11-07 NOTE — Telephone Encounter (Signed)
Called pt as she NS for appt today.  She reports she overslept.  Reminded of next appt.  Clarita Crane, PT, DPT 11/07/19 8:27 AM

## 2019-11-13 ENCOUNTER — Encounter: Payer: Medicare PPO | Admitting: Physical Therapy

## 2019-11-15 ENCOUNTER — Encounter: Payer: Medicare PPO | Admitting: Physical Therapy

## 2019-11-19 ENCOUNTER — Other Ambulatory Visit: Payer: Self-pay

## 2019-11-19 ENCOUNTER — Encounter: Payer: Self-pay | Admitting: Physical Therapy

## 2019-11-19 ENCOUNTER — Ambulatory Visit: Payer: Medicare PPO | Admitting: Physical Therapy

## 2019-11-19 DIAGNOSIS — R262 Difficulty in walking, not elsewhere classified: Secondary | ICD-10-CM

## 2019-11-19 DIAGNOSIS — M6281 Muscle weakness (generalized): Secondary | ICD-10-CM | POA: Diagnosis not present

## 2019-11-19 DIAGNOSIS — R2689 Other abnormalities of gait and mobility: Secondary | ICD-10-CM

## 2019-11-19 DIAGNOSIS — M25671 Stiffness of right ankle, not elsewhere classified: Secondary | ICD-10-CM

## 2019-11-19 DIAGNOSIS — M25571 Pain in right ankle and joints of right foot: Secondary | ICD-10-CM | POA: Diagnosis not present

## 2019-11-19 NOTE — Therapy (Signed)
[facility] Physical Therapy [address] [place], [place], [address] Phone: [phone]   Fax:  [phone]  Physical Therapy Treatment  Patient Details  Name: Rachel Ayers MRN: [id number] Date of Birth: 1955/09/18 Referring Provider (PT): Gwendolyn Fill, [place]   Encounter Date: 11/19/2019  PT End of Session - 11/19/19 1142    Visit Number  7    Number of Visits  12    Date for PT Re-Evaluation  11/08/19    Authorization Type  12 visits through 6/11    Progress Note Due on Visit  10    PT Start Time  1100    PT Stop Time  1143    PT Time Calculation (min)  43 min    Activity Tolerance  No increased pain;Patient tolerated treatment well    Behavior During Therapy  [facility] for tasks assessed/performed       History reviewed. No pertinent past medical history.  History reviewed. No pertinent surgical history.  There were no vitals filed for this visit.  Subjective Assessment - 11/19/19 1057    Subjective  doing well.  going to the pool 3 days a week    Pertinent History  left hip replacement 2018, HTN, DM, stage 3 kidney disease    Limitations  Walking;Standing    Diagnostic tests  X-ray    Patient Stated Goals  Walk without pain    Currently in Pain?  No/denies    Pain Onset  --                        [facility] Adult PT Treatment/Exercise - 11/19/19 1102      Iontophoresis   Type of Iontophoresis  Dexamethasone    Location  R achilles tendon    Dose  4mg /44ml    Time  6 hour patch      Manual Therapy   Soft tissue mobilization  IASTM to achilles/gastroc/soleus on right      Ankle Exercises: Aerobic   Nustep  L5 x 6 min; LE only      Ankle Exercises: Stretches   Soleus Stretch  5 reps;20 seconds   Rt, on slant board   Slant Board Stretch  5 reps;30 seconds      Ankle Exercises: Standing   Heel Raises  Right;20 reps    Heel Raises Limitations  4 way with green theraband x 5 reps each    Other Standing Ankle Exercises   tandem gait on foam x 3 round trips with bil UE support      Ankle Exercises: Seated   Other Seated Ankle Exercises  4 way ankle exercises using Level 3 theraband 2 x 10     Other Seated Ankle Exercises  seated arch lifts 3x10               PT Short Term Goals - 11/19/19 1143      PT SHORT TERM GOAL #1   Title  Perform BERG balance test and create LTG if appropriate.    Baseline  no fall risk identified    Time  2    Period  Weeks    Status  Deferred    Target Date  11/08/19        PT Long Term Goals - 10/25/19 1158      PT LONG TERM GOAL #1   Title  Pt will be independent in her HEP and progression.    Time  6  Period  Weeks    Status  On-going      PT LONG TERM GOAL #2   Title  Pt will be able to amb 30 minutes with pain </= 3/10 in R achilles.    Baseline  currently pain is 7/10 with walking short distances    Time  6    Period  Weeks    Status  On-going      PT LONG TERM GOAL #3   Title  Pt will improve her R DF to >/= 10 degrees to assit with normalized gait and heel strike.    Baseline  -5 degrees from neutral    Time  6    Period  Weeks    Status  On-going            Plan - 11/19/19 1250    Clinical Impression Statement  Pt tolerated increased activity well today with expected muscle fatigue after session.  Pt at end of [facility] authorization on 6/11 and would like to see how she's doing on 6/11 to decide whether to continue or d/c.  Will plan to reassess and recert if needed.    Personal Factors and Comorbidities  Comorbidity 3+    Comorbidities  DM, R hip replacement, HTN, R rotator cuff injury, L elbow injury due to fall, recent history of 2 falls, L ankle sprain. kidney disease stage 3.    Examination-Activity Limitations  Squat;Stairs;Stand;Other    Examination-Participation Restrictions  Community Activity;Driving;Other    Stability/Clinical Decision Making  Stable/Uncomplicated    Rehab Potential  Good    PT Frequency  2x / week    PT  Duration  6 weeks    PT Treatment/Interventions  Cryotherapy;Electrical Stimulation;Iontophoresis 4mg /ml Dexamethasone;Moist Heat;Ultrasound;Gait training;Stair training;Functional mobility training;Neuromuscular re-education;Balance training;Therapeutic exercise;Therapeutic activities;Patient/family education;Orthotic Fit/Training;Taping;Dry needling;Manual techniques    PT Next Visit Plan  see if she wants to continue or d/c (will need to resubmit to [employer] if she wants to continue), teach taping for support, ionto, check goals    PT Home Exercise Plan  Upper and lower heels cords stretching along with calf muscle strengthening.    Consulted and Agree with Plan of Care  Patient       Patient will benefit from skilled therapeutic intervention in order to improve the following deficits and impairments:  Pain, Postural dysfunction, Decreased strength, Decreased activity tolerance, Decreased range of motion, Difficulty walking, Abnormal gait, Decreased balance  Visit Diagnosis: Pain in right ankle and joints of right foot  Difficulty in walking, not elsewhere classified  Muscle weakness (generalized)  Other abnormalities of gait and mobility  Stiffness of right ankle, not elsewhere classified     Problem List Patient Active Problem List   Diagnosis Date Noted  . Diabetes ([place]) 10/18/2019      Laureen Abrahams, PT, DPT 11/19/19 12:53 PM    [facility] Physical Therapy [address] [place], [place], [address] Phone: [phone]   Fax:  [phone]  Name: Anushri Casalino MRN: [id number] Date of Birth: Jul 27, 1955

## 2019-11-22 ENCOUNTER — Other Ambulatory Visit: Payer: Self-pay

## 2019-11-22 ENCOUNTER — Encounter: Payer: Medicare PPO | Admitting: Physical Therapy

## 2019-11-22 ENCOUNTER — Encounter: Payer: Self-pay | Admitting: Rehabilitative and Restorative Service Providers"

## 2019-11-22 ENCOUNTER — Ambulatory Visit: Payer: Medicare PPO | Admitting: Rehabilitative and Restorative Service Providers"

## 2019-11-22 DIAGNOSIS — R262 Difficulty in walking, not elsewhere classified: Secondary | ICD-10-CM

## 2019-11-22 DIAGNOSIS — R2689 Other abnormalities of gait and mobility: Secondary | ICD-10-CM

## 2019-11-22 DIAGNOSIS — M25671 Stiffness of right ankle, not elsewhere classified: Secondary | ICD-10-CM

## 2019-11-22 DIAGNOSIS — M25571 Pain in right ankle and joints of right foot: Secondary | ICD-10-CM

## 2019-11-22 DIAGNOSIS — M6281 Muscle weakness (generalized): Secondary | ICD-10-CM | POA: Diagnosis not present

## 2019-11-22 NOTE — Therapy (Signed)
[facility] Physical Therapy [address] [place], [place], [address] Phone: [phone]   Fax:  [phone]  Physical Therapy Treatment  Patient Details  Name: Rachel Ayers MRN: [id number] Date of Birth: 1955-06-17 Referring Provider (PT): Marrianne Mood, [place]   Encounter Date: 11/22/2019   PT End of Session - 11/22/19 1315    Visit Number 1    Number of Visits 4    Date for PT Re-Evaluation 01/10/20    Authorization Type 12 visits through 6/11    Progress Note Due on Visit 4    PT Start Time 9735    PT Stop Time 3299    PT Time Calculation (min) 45 min    Activity Tolerance No increased pain;Patient tolerated treatment well    Behavior During Therapy [facility] for tasks assessed/performed           No past medical history on file.  No past surgical history on file.  There were no vitals filed for this visit.                              PT Education - 11/22/19 1617    Education Details Reviewed and updated HEP.  Provided handouts to help with participation.    Person(s) Educated Patient    Methods Explanation;Demonstration;Tactile cues;Verbal cues;Handout    Comprehension Verbal cues required;Need further instruction;Returned demonstration;Verbalized understanding;Tactile cues required            PT Short Term Goals - 11/22/19 1514      PT SHORT TERM GOAL #1   Title Perform BERG balance test and create LTG if appropriate.    Baseline no fall risk identified    Time 2    Period Weeks    Status Deferred    Target Date 11/08/19             PT Long Term Goals - 11/22/19 1514      PT LONG TERM GOAL #1   Title Pt will be independent in her HEP and progression.    Time 6    Period Weeks    Status Partially Met      PT LONG TERM GOAL #2   Title Pt will be able to amb 30 minutes with pain </= 3/10 in R achilles.    Baseline Currently pain is 4/10 with daily walking (was 7/10 with walking short distances).     Time 6    Period Weeks    Status Partially Met      PT LONG TERM GOAL #3   Title Pt will improve her R DF to >/= 10 degrees to assit with normalized gait and heel strike.    Baseline Neutral (-5 degrees from neutral).    Time 6    Period Weeks    Status On-going      PT LONG TERM GOAL #4   Title Rachel Ayers will be able to complete a single leg heel raise on the R.    Baseline Unable at evaluation.    Status New                 Plan - 11/22/19 1514    Clinical Impression Statement Rachel Ayers is now able to complete a single leg heel raise on the right (unable at evaluation).  R ankle dorsiflexion AROM is now to neutral (was 5 degrees plantar flexed).  Walking endurance has improved, although Achilles tenderness is still present with longer walking (  shopping) and later in the day due to remaining stiffness and weakness of the R Achilles tendon.  Rachel Ayers has only attended 8 of the requested 12 visits.  With the additional 4 visits, I expect she will meet long-term goals and reduce her risk of a future Achilles rupture.    Personal Factors and Comorbidities Comorbidity 3+    Comorbidities DM, R hip replacement, HTN, R rotator cuff injury, L elbow injury due to fall, recent history of 2 falls, L ankle sprain. kidney disease stage 3.    Examination-Activity Limitations Squat;Stairs;Stand;Other    Examination-Participation Restrictions Community Activity;Driving;Other    Stability/Clinical Decision Making Stable/Uncomplicated    Rehab Potential Good    PT Frequency 2x / week    PT Duration 6 weeks    PT Treatment/Interventions Cryotherapy;Electrical Stimulation;Iontophoresis '4mg'$ /ml Dexamethasone;Moist Heat;Ultrasound;Gait training;Stair training;Functional mobility training;Neuromuscular re-education;Balance training;Therapeutic exercise;Therapeutic activities;Patient/family education;Orthotic Fit/Training;Taping;Dry needling;Manual techniques    PT Next Visit Plan Review progress with HEP.     PT Home Exercise Plan Upper and lower heels cords stretching along with calf muscle strengthening.    Consulted and Agree with Plan of Care Patient           Patient will benefit from skilled therapeutic intervention in order to improve the following deficits and impairments:  Pain, Postural dysfunction, Decreased strength, Decreased activity tolerance, Decreased range of motion, Difficulty walking, Abnormal gait, Decreased balance  Visit Diagnosis: Pain in right ankle and joints of right foot  Difficulty in walking, not elsewhere classified  Muscle weakness (generalized)  Other abnormalities of gait and mobility  Stiffness of right ankle, not elsewhere classified     Problem List Patient Active Problem List   Diagnosis Date Noted  . Diabetes ([place]) 10/18/2019    Farley Ly PT, MPT 11/22/2019, 4:26 PM  [facility] Physical Therapy [address] [place], [place], [address] Phone: [phone]   Fax:  [phone]  Name: Rachel Ayers MRN: [id number] Date of Birth: Jan 26, 1956

## 2019-11-22 NOTE — Patient Instructions (Signed)
Access Code: [email or web address] URL: [email or web address] Date: 11/22/2019 Prepared by: Pauletta Browns  Exercises Standing Gastroc Stretch - 3 x daily - 7 x weekly - 1 sets - 5 reps - 20 hold Slant Board Gastrocnemius Stretch - 3 x daily - 7 x weekly - 1 sets - 3 reps - 60 hold Standing Soleus Stretch - 3 x daily - 7 x weekly - 1 sets - 5 reps - 20 hold Standing Heel Raise - 10-20 x daily - 7 x weekly - 1 sets - 5 reps - 3 seconds hold

## 2019-11-22 NOTE — Therapy (Signed)
[facility] [address] [place], [place], [address] Phone: [phone]   Fax:  [phone]  Physical Therapy Treatment  Patient Details  Name: Rachel Ayers MRN: [id number] Date of Birth: 06-29-1955 Referring Provider (PT): Marrianne Mood, [place]   Encounter Date: 11/22/2019   PT End of Session - 11/22/19 1315    Visit Number 1    Number of Visits 4    Date for PT Re-Evaluation 01/10/20    Authorization Type 12 visits through 6/11    Progress Note Due on Visit 4    PT Start Time 2563    PT Stop Time 8937    PT Time Calculation (min) 45 min    Activity Tolerance No increased pain;Patient tolerated treatment well    Behavior During Therapy [facility] for tasks assessed/performed           History reviewed. No pertinent past medical history.  History reviewed. No pertinent surgical history.  There were no vitals filed for this visit.   Subjective Assessment - 11/22/19 1645    Subjective Allesandra feels as if she is making progress with her Achilles tendonitis.    Pertinent History left hip replacement 2018, HTN, DM, stage 3 kidney disease    Limitations Walking;Standing    How long can you stand comfortably? 60 minutes    How long can you walk comfortably? 20+ minutes (was <5 at evaluation)    Diagnostic tests X-ray    Patient Stated Goals Walk without pain    Currently in Pain? Yes    Pain Score 3     Pain Location Ankle    Pain Orientation Right    Pain Descriptors / Indicators Aching    Pain Type Chronic pain    Pain Onset More than a month ago    Pain Frequency Intermittent    Aggravating Factors  Overuse    Pain Relieving Factors Ice and exercises    Effect of Pain on Daily Activities Limited endurance with standing and walking.              [facility] PT Assessment - 11/22/19 0001      ROM / Strength   AROM / PROM / Strength AROM;Strength      AROM   Overall AROM  Deficits    AROM Assessment Site Ankle    Right/Left  Ankle Left;Right    Right Ankle Dorsiflexion 0    Left Ankle Dorsiflexion 0      Strength   Overall Strength Deficits    Strength Assessment Site Ankle    Right/Left Ankle Left;Right    Right Ankle Plantar Flexion 3+/5    Left Ankle Plantar Flexion 4/5                         [facility] Adult PT Treatment/Exercise - 11/22/19 0001      Ankle Exercises: Stretches   Soleus Stretch 5 reps;20 seconds   Slight toe in   Gastroc Stretch 5 reps;20 seconds   slight toe in   Slant Board Stretch 3 reps;60 seconds   Upper and lower     Ankle Exercises: Standing   Heel Raises Right;20 reps;3 seconds   Up B/down R slow eccentrics 2 sets of 10   Heel Raises Limitations 3 sets of 10 with slow eccentrics (100/day recommended)                  PT Education - 11/22/19 1617    Education  Details Reviewed and updated HEP.  Provided handouts to help with participation.    Person(s) Educated Patient    Methods Explanation;Demonstration;Tactile cues;Verbal cues;Handout    Comprehension Verbal cues required;Need further instruction;Returned demonstration;Verbalized understanding;Tactile cues required            PT Short Term Goals - 11/22/19 1514      PT SHORT TERM GOAL #1   Title Perform BERG balance test and create LTG if appropriate.    Baseline no fall risk identified    Time 2    Period Weeks    Status Deferred    Target Date 11/08/19             PT Long Term Goals - 11/22/19 1514      PT LONG TERM GOAL #1   Title Pt will be independent in her HEP and progression.    Time 6    Period Weeks    Status Partially Met      PT LONG TERM GOAL #2   Title Pt will be able to amb 30 minutes with pain </= 3/10 in R achilles.    Baseline Currently pain is 4/10 with daily walking (was 7/10 with walking short distances).    Time 6    Period Weeks    Status Partially Met      PT LONG TERM GOAL #3   Title Pt will improve her R DF to >/= 10 degrees to assit with normalized  gait and heel strike.    Baseline Neutral (-5 degrees from neutral).    Time 6    Period Weeks    Status On-going      PT LONG TERM GOAL #4   Title Arlen will be able to complete a single leg heel raise on the R.    Baseline Unable at evaluation.    Status New                 Plan - 11/22/19 1514    Clinical Impression Statement Laiken is now able to complete a single leg heel raise on the right (unable at evaluation).  R ankle dorsiflexion AROM is now to neutral (was 5 degrees plantar flexed).  Walking endurance has improved, although Achilles tenderness is still present with longer walking (shopping) and later in the day due to remaining stiffness and weakness of the R Achilles tendon.  Nakeysha has only attended 8 of the requested 12 visits.  With the additional 4 visits, I expect she will meet long-term goals and reduce her risk of a future Achilles rupture.    Personal Factors and Comorbidities Comorbidity 3+    Comorbidities DM, R hip replacement, HTN, R rotator cuff injury, L elbow injury due to fall, recent history of 2 falls, L ankle sprain. kidney disease stage 3.    Examination-Activity Limitations Squat;Stairs;Stand;Other    Examination-Participation Restrictions Community Activity;Driving;Other    Stability/Clinical Decision Making Stable/Uncomplicated    Rehab Potential Good    PT Frequency 2x / week    PT Duration 6 weeks    PT Treatment/Interventions Cryotherapy;Electrical Stimulation;Iontophoresis 32m/ml Dexamethasone;Moist Heat;Ultrasound;Gait training;Stair training;Functional mobility training;Neuromuscular re-education;Balance training;Therapeutic exercise;Therapeutic activities;Patient/family education;Orthotic Fit/Training;Taping;Dry needling;Manual techniques    PT Next Visit Plan Review progress with HEP.    PT Home Exercise Plan Upper and lower heels cords stretching along with calf muscle strengthening.    Consulted and Agree with Plan of Care Patient            Patient will benefit from skilled  therapeutic intervention in order to improve the following deficits and impairments:  Pain, Postural dysfunction, Decreased strength, Decreased activity tolerance, Decreased range of motion, Difficulty walking, Abnormal gait, Decreased balance  Visit Diagnosis: Pain in right ankle and joints of right foot  Difficulty in walking, not elsewhere classified  Muscle weakness (generalized)  Other abnormalities of gait and mobility  Stiffness of right ankle, not elsewhere classified     Problem List Patient Active Problem List   Diagnosis Date Noted  . Diabetes ([place]) 10/18/2019    Farley Ly PT, MPT 11/22/2019, 4:49 PM  [facility] Physical Therapy [address] [place], [place], [address] Phone: [phone]   Fax:  [phone]  Name: Atasha Colebank MRN: [id number] Date of Birth: [birth year]

## 2019-11-26 ENCOUNTER — Encounter: Payer: Medicare PPO | Admitting: Rehabilitative and Restorative Service Providers"

## 2019-11-29 ENCOUNTER — Encounter: Payer: Medicare PPO | Admitting: Rehabilitative and Restorative Service Providers"

## 2019-11-29 ENCOUNTER — Encounter: Payer: Medicare PPO | Admitting: Physical Therapy

## 2019-12-18 ENCOUNTER — Ambulatory Visit: Payer: Medicare PPO | Admitting: Endocrinology

## 2019-12-19 ENCOUNTER — Ambulatory Visit: Payer: Medicare PPO | Admitting: Rehabilitative and Restorative Service Providers"

## 2019-12-19 ENCOUNTER — Other Ambulatory Visit: Payer: Self-pay

## 2019-12-19 ENCOUNTER — Encounter: Payer: Self-pay | Admitting: Rehabilitative and Restorative Service Providers"

## 2019-12-19 DIAGNOSIS — M25571 Pain in right ankle and joints of right foot: Secondary | ICD-10-CM | POA: Diagnosis not present

## 2019-12-19 DIAGNOSIS — R262 Difficulty in walking, not elsewhere classified: Secondary | ICD-10-CM | POA: Diagnosis not present

## 2019-12-19 DIAGNOSIS — M6281 Muscle weakness (generalized): Secondary | ICD-10-CM | POA: Diagnosis not present

## 2019-12-19 NOTE — Therapy (Signed)
[facility] Physical Therapy [address] [place], [place], [address] Phone: [phone]   Fax:  [phone]  Physical Therapy Treatment  Patient Details  Name: Rachel Ayers MRN: [id number] Date of Birth: [birth year] Referring Provider (PT): Marrianne Mood, [place]   Encounter Date: 12/19/2019   PT End of Session - 12/19/19 1443    Visit Number 2    Number of Visits 4    Date for PT Re-Evaluation 01/10/20    Authorization Type 12 visits through 6/11    Progress Note Due on Visit 4    PT Start Time 1301    PT Stop Time 1345    PT Time Calculation (min) 44 min    Activity Tolerance No increased pain;Patient tolerated treatment well    Behavior During Therapy [facility] for tasks assessed/performed           History reviewed. No pertinent past medical history.  History reviewed. No pertinent surgical history.  There were no vitals filed for this visit.   Subjective Assessment - 12/19/19 1439    Subjective Jamicia has been mostly pain-free with her independent rehabilitation.  Non-compliance over the past 5 days have led to an exacerbation of symptoms.    Pertinent History left hip replacement 2018, HTN, DM, stage 3 kidney disease    Limitations Walking;Standing    How long can you stand comfortably? 60 minutes    How long can you walk comfortably? 20+ minutes (was <5 at evaluation)    Diagnostic tests X-ray    Patient Stated Goals Walk without pain    Currently in Pain? Yes    Pain Score 3     Pain Location Ankle    Pain Orientation Right;Posterior    Pain Descriptors / Indicators Aching;Sharp    Pain Type Chronic pain    Pain Radiating Towards Achilles    Pain Onset More than a month ago    Pain Frequency Intermittent    Aggravating Factors  Overuse and lack of HEP compliance    Pain Relieving Factors Exercise compliance and avoiding overuse    Effect of Pain on Daily Activities Pain with walking if exercises are not done consistently               [facility] PT Assessment - 12/19/19 0001      ROM / Strength   AROM / PROM / Strength AROM;Strength      AROM   Overall AROM  Deficits    AROM Assessment Site Ankle    Right/Left Ankle Left;Right    Right Ankle Dorsiflexion 0    Left Ankle Dorsiflexion 0      Strength   Overall Strength Deficits    Strength Assessment Site Ankle    Right/Left Ankle Left;Right    Right Ankle Plantar Flexion 3+/5    Left Ankle Plantar Flexion 4/5                         [facility] Adult PT Treatment/Exercise - 12/19/19 0001      Ankle Exercises: Stretches   Slant Board Stretch 5 reps;60 seconds   Upper and lower     Ankle Exercises: Standing   Heel Raises Right;5 reps;3 seconds   Up B/down R slow eccentrics 6 sets of 5   Heel Raises Limitations 3 sets of 10 with slow eccentrics (100/day recommended)    Other Standing Ankle Exercises Standing hip hike 2 sets of 10  3 seconds  PT Education - 12/19/19 1442    Education Details Reviewed and updated HEP with focus on heel cords stretching and strengthening.    Person(s) Educated Patient    Methods Explanation;Demonstration;Verbal cues    Comprehension Verbalized understanding;Returned demonstration;Need further instruction;Verbal cues required            PT Short Term Goals - 12/19/19 1442      PT SHORT TERM GOAL #1   Title Perform BERG balance test and create LTG if appropriate.    Baseline no fall risk identified    Time 2    Period Weeks    Status Deferred    Target Date 11/08/19             PT Long Term Goals - 12/19/19 1443      PT LONG TERM GOAL #1   Title Pt will be independent in her HEP and progression.    Time 6    Period Weeks    Status Partially Met      PT LONG TERM GOAL #2   Title Pt will be able to amb 30 minutes with pain </= 3/10 in R achilles.    Baseline Currently pain is 3/10 with daily walking (was 7/10 with walking short distances).    Time 6    Period Weeks     Status Partially Met      PT LONG TERM GOAL #3   Title Pt will improve her R DF to >/= 10 degrees to assit with normalized gait and heel strike.    Baseline Neutral (-5 degrees from neutral).    Time 6    Period Weeks    Status On-going      PT LONG TERM GOAL #4   Title Orlando will be able to complete a single leg heel raise on the R.    Baseline Unable at evaluation.    Status Achieved                 Plan - 12/19/19 1343    Clinical Impression Statement Andreal reports good HEP compliance and minimal pain after her last PT visit.  However, over the past 5 days she has not completed her HEP and her pain has returned.  We reviewed her HEP today and used iontophoresis per her request.  With consistent HEP compliance, her prognosis remains good.    Personal Factors and Comorbidities Comorbidity 3+    Comorbidities DM, R hip replacement, HTN, R rotator cuff injury, L elbow injury due to fall, recent history of 2 falls, L ankle sprain. kidney disease stage 3.    Examination-Activity Limitations Squat;Stairs;Stand;Other    Examination-Participation Restrictions Community Activity;Driving;Other    Stability/Clinical Decision Making Stable/Uncomplicated    Rehab Potential Good    PT Frequency 2x / week    PT Duration 6 weeks    PT Treatment/Interventions Cryotherapy;Electrical Stimulation;Iontophoresis '4mg'$ /ml Dexamethasone;Moist Heat;Ultrasound;Gait training;Stair training;Functional mobility training;Neuromuscular re-education;Balance training;Therapeutic exercise;Therapeutic activities;Patient/family education;Orthotic Fit/Training;Taping;Dry needling;Manual techniques    PT Next Visit Plan Heel cords stretching and strengthening.    PT Home Exercise Plan Upper and lower heels cords stretching along with calf muscle strengthening.    Consulted and Agree with Plan of Care Patient           Patient will benefit from skilled therapeutic intervention in order to improve the following  deficits and impairments:  Pain, Postural dysfunction, Decreased strength, Decreased activity tolerance, Decreased range of motion, Difficulty walking, Abnormal gait, Decreased balance  Visit Diagnosis: Pain in  right ankle and joints of right foot  Difficulty in walking, not elsewhere classified  Muscle weakness (generalized)     Problem List Patient Active Problem List   Diagnosis Date Noted   Diabetes ([place]) 10/18/2019    Farley Ly PT, MPT 12/19/2019, 2:45 PM  [facility] Physical Therapy [address] [place], [place], [address] Phone: [phone]   Fax:  [phone]  Name: Chella Chapdelaine MRN: [id number] Date of Birth: 19-Nov-1955

## 2019-12-30 ENCOUNTER — Other Ambulatory Visit: Payer: Self-pay

## 2019-12-30 ENCOUNTER — Ambulatory Visit: Payer: Medicare PPO | Admitting: Physical Therapy

## 2019-12-30 ENCOUNTER — Encounter: Payer: Self-pay | Admitting: Physical Therapy

## 2019-12-30 DIAGNOSIS — R262 Difficulty in walking, not elsewhere classified: Secondary | ICD-10-CM | POA: Diagnosis not present

## 2019-12-30 DIAGNOSIS — M25571 Pain in right ankle and joints of right foot: Secondary | ICD-10-CM

## 2019-12-30 NOTE — Therapy (Signed)
[facility] Physical Therapy [address] [place], [place], [address] Phone: [phone]   Fax:  [phone]  Physical Therapy Treatment  Patient Details  Name: Rachel Ayers MRN: [id number] Date of Birth: January 10, 1956 Referring Provider (PT): Marrianne Mood, [place]   Encounter Date: 12/30/2019   PT End of Session - 12/30/19 1357    Visit Number 2    Number of Visits 4    Date for PT Re-Evaluation 01/10/20    Authorization Type 12 visits through 6/11    Progress Note Due on Visit 4    PT Start Time 1345    PT Stop Time 1425    PT Time Calculation (min) 40 min    Activity Tolerance No increased pain;Patient tolerated treatment well    Behavior During Therapy [facility] for tasks assessed/performed           History reviewed. No pertinent past medical history.  History reviewed. No pertinent surgical history.  There were no vitals filed for this visit.   Subjective Assessment - 12/30/19 1354    Subjective Pt rerporting that today's pain is 2/10. Pt stated he bought a slant board for home stretching. Pt feels that the iontophoresis is still helping.    Pertinent History left hip replacement 2018, HTN, DM, stage 3 kidney disease    Limitations Walking;Standing    How long can you stand comfortably? 60 minutes    How long can you walk comfortably? 20+ minutes (was <5 at evaluation)    Patient Stated Goals Walk without pain    Currently in Pain? Yes    Pain Score 2     Pain Location Ankle    Pain Orientation Right    Pain Descriptors / Indicators Aching;Sore    Pain Type Chronic pain    Pain Onset More than a month ago                             [facility] Adult PT Treatment/Exercise - 12/30/19 0001      Iontophoresis   Type of Iontophoresis Dexamethasone    Location R achilles tendon    Dose '4mg'$ /90m    Time 6 hour patch      Manual Therapy   Manual therapy comments kinesiotaping to R achiles      Ankle Exercises: Stretches    Slant Board Stretch 5 reps;60 seconds   Upper and lower     Ankle Exercises: Standing   Heel Raises Right;5 reps;3 seconds   Up B/down R slow eccentrics 6 sets of 5   Heel Raises Limitations 3 sets of 10 with slow eccentrics (100/day recommended)    Other Standing Ankle Exercises Standing hip hike 2 sets of 10  3 seconds      Ankle Exercises: Machines for Strengthening   Cybex Leg Press Leg Press 100 pounds x 10, Heel raises 75# 5 reps x 3 sets bilateral LE's                    PT Short Term Goals - 12/19/19 1442      PT SHORT TERM GOAL #1   Title Perform BERG balance test and create LTG if appropriate.    Baseline no fall risk identified    Time 2    Period Weeks    Status Deferred    Target Date 11/08/19             PT Long Term Goals - 12/19/19  [place] #1   Title Pt will be independent in her HEP and progression.    Time 6    Period Weeks    Status Partially Met      PT LONG TERM GOAL #2   Title Pt will be able to amb 30 minutes with pain </= 3/10 in R achilles.    Baseline Currently pain is 3/10 with daily walking (was 7/10 with walking short distances).    Time 6    Period Weeks    Status Partially Met      PT LONG TERM GOAL #3   Title Pt will improve her R DF to >/= 10 degrees to assit with normalized gait and heel strike.    Baseline Neutral (-5 degrees from neutral).    Time 6    Period Weeks    Status On-going      PT LONG TERM GOAL #4   Title Misako will be able to complete a single leg heel raise on the R.    Baseline Unable at evaluation.    Status Achieved                 Plan - 12/30/19 1359    Clinical Impression Statement Pt tolreating all exercises well. Instructed in foccusing on heel raises and achilles stretching. [place] patch applied and pt instructed in when to take it off. Continue skilled PT.    Personal Factors and Comorbidities Comorbidity 3+    Comorbidities DM, R hip replacement, HTN, R rotator  cuff injury, L elbow injury due to fall, recent history of 2 falls, L ankle sprain. kidney disease stage 3.    Examination-Activity Limitations Squat;Stairs;Stand;Other    Stability/Clinical Decision Making Stable/Uncomplicated    Rehab Potential Good    PT Frequency 2x / week    PT Duration 6 weeks    PT Treatment/Interventions Cryotherapy;Electrical Stimulation;Iontophoresis '4mg'$ /ml Dexamethasone;Moist Heat;Ultrasound;Gait training;Stair training;Functional mobility training;Neuromuscular re-education;Balance training;Therapeutic exercise;Therapeutic activities;Patient/family education;Orthotic Fit/Training;Taping;Dry needling;Manual techniques    PT Next Visit Plan Heel cords stretching and strengthening.    PT Home Exercise Plan Upper and lower heels cords stretching along with calf muscle strengthening.    Consulted and Agree with Plan of Care Patient           Patient will benefit from skilled therapeutic intervention in order to improve the following deficits and impairments:  Pain, Postural dysfunction, Decreased strength, Decreased activity tolerance, Decreased range of motion, Difficulty walking, Abnormal gait, Decreased balance  Visit Diagnosis: Pain in right ankle and joints of right foot  Difficulty in walking, not elsewhere classified     Problem List Patient Active Problem List   Diagnosis Date Noted  . Diabetes ([place]) 10/18/2019    Oretha Caprice, PT, MPT 12/30/2019, 2:24 PM  [facility] Physical Therapy [address] [place], [place], [address] Phone: [phone]   Fax:  [phone]  Name: [name] MRN: [id number] Date of Birth: 1955-11-30

## 2020-01-07 ENCOUNTER — Encounter: Payer: Self-pay | Admitting: Physical Therapy

## 2020-01-07 ENCOUNTER — Other Ambulatory Visit: Payer: Self-pay

## 2020-01-07 ENCOUNTER — Ambulatory Visit: Payer: Medicare PPO | Admitting: Physical Therapy

## 2020-01-07 DIAGNOSIS — R2689 Other abnormalities of gait and mobility: Secondary | ICD-10-CM

## 2020-01-07 DIAGNOSIS — M6281 Muscle weakness (generalized): Secondary | ICD-10-CM

## 2020-01-07 DIAGNOSIS — R262 Difficulty in walking, not elsewhere classified: Secondary | ICD-10-CM

## 2020-01-07 DIAGNOSIS — M25671 Stiffness of right ankle, not elsewhere classified: Secondary | ICD-10-CM | POA: Diagnosis not present

## 2020-01-07 DIAGNOSIS — M25571 Pain in right ankle and joints of right foot: Secondary | ICD-10-CM

## 2020-01-07 NOTE — Therapy (Signed)
[facility] Physical Therapy [address] [place], [place], [address] Phone: [phone]   Fax:  [phone]  Physical Therapy Treatment  Patient Details  Name: Rachel Ayers MRN: [id number] Date of Birth: [birth year] Referring Provider (PT): Marrianne Mood, [place]   Encounter Date: 01/07/2020   PT End of Session - 01/07/20 1106    Visit Number 11    Number of Visits 12    Date for PT Re-Evaluation 01/10/20    Authorization Time Period 6-11-8/11    PT Start Time 1014    PT Stop Time 1054    PT Time Calculation (min) 40 min    Activity Tolerance No increased pain;Patient tolerated treatment well    Behavior During Therapy [facility] for tasks assessed/performed           History reviewed. No pertinent past medical history.  History reviewed. No pertinent surgical history.  There were no vitals filed for this visit.   Subjective Assessment - 01/07/20 1013    Subjective "today happens to be one of the bad days.  i think it's my shoes, i got a pedicure and had to show them off."    Pertinent History left hip replacement 2018, HTN, DM, stage 3 kidney disease    Limitations Walking;Standing    How long can you stand comfortably? 60 minutes    How long can you walk comfortably? 20+ minutes (was <5 at evaluation)    Patient Stated Goals Walk without pain    Currently in Pain? Yes    Pain Score 6     Pain Location Ankle    Pain Orientation Right    Pain Descriptors / Indicators Aching;Sore    Pain Type Chronic pain    Pain Onset More than a month ago    Pain Frequency Intermittent    Aggravating Factors  overuse, lack of HEP compliance    Pain Relieving Factors exercise compliance                             [facility] Adult PT Treatment/Exercise - 01/07/20 1017      Manual Therapy   Soft tissue mobilization IASTM with BioFreeze to achilles/gastroc/soleus on right      Ankle Exercises: Stretches   Slant Board Stretch 5 reps;20 seconds    bent and straight knees     Ankle Exercises: Standing   Rocker Board 3 minutes   ant/post   Heel Raises Both;3 seconds;15 reps   off 6" step   Balance Beam tandem forwards/backwards with light UE support x 4 laps, then side stepping x 4 laps      Ankle Exercises: Machines for Strengthening   Cybex Leg Press 100# 3x10                  PT Education - 01/07/20 1105    Education Details possiblilty of using heel lift while on vacation, pt to bring shoes next visit    Person(s) Educated Patient    Methods Explanation;Demonstration;Handout    Comprehension Verbalized understanding;Returned demonstration;Need further instruction            PT Short Term Goals - 12/19/19 1442      PT SHORT TERM GOAL #1   Title Perform BERG balance test and create LTG if appropriate.    Baseline no fall risk identified    Time 2    Period Weeks    Status Deferred    Target Date 11/08/19  PT Long Term Goals - 12/19/19 1443      PT LONG TERM GOAL #1   Title Pt will be independent in her HEP and progression.    Time 6    Period Weeks    Status Partially Met      PT LONG TERM GOAL #2   Title Pt will be able to amb 30 minutes with pain </= 3/10 in R achilles.    Baseline Currently pain is 3/10 with daily walking (was 7/10 with walking short distances).    Time 6    Period Weeks    Status Partially Met      PT LONG TERM GOAL #3   Title Pt will improve her R DF to >/= 10 degrees to assit with normalized gait and heel strike.    Baseline Neutral (-5 degrees from neutral).    Time 6    Period Weeks    Status On-going      PT LONG TERM GOAL #4   Title Murl will be able to complete a single leg heel raise on the R.    Baseline Unable at evaluation.    Status Achieved                 Plan - 01/07/20 1107    Clinical Impression Statement Pt with elevated pain today which improved with session.  Plan to d/c PT next visit, as she has well established HEP and is  at end of POC.  Pt may benefit from heel lift to help with prolonged walking which is anticipated on upcoming trip.    Personal Factors and Comorbidities Comorbidity 3+    Comorbidities DM, R hip replacement, HTN, R rotator cuff injury, L elbow injury due to fall, recent history of 2 falls, L ankle sprain. kidney disease stage 3.    Examination-Activity Limitations Squat;Stairs;Stand;Other    Stability/Clinical Decision Making Stable/Uncomplicated    Rehab Potential Good    PT Frequency 2x / week    PT Duration 6 weeks    PT Treatment/Interventions Cryotherapy;Electrical Stimulation;Iontophoresis '4mg'$ /ml Dexamethasone;Moist Heat;Ultrasound;Gait training;Stair training;Functional mobility training;Neuromuscular re-education;Balance training;Therapeutic exercise;Therapeutic activities;Patient/family education;Orthotic Fit/Training;Taping;Dry needling;Manual techniques    PT Next Visit Plan look at goals, finalize HEP. she should have shoes to fit for heel lift, d/c PT    PT Home Exercise Plan Upper and lower heels cords stretching along with calf muscle strengthening.    Consulted and Agree with Plan of Care Patient           Patient will benefit from skilled therapeutic intervention in order to improve the following deficits and impairments:  Pain, Postural dysfunction, Decreased strength, Decreased activity tolerance, Decreased range of motion, Difficulty walking, Abnormal gait, Decreased balance  Visit Diagnosis: Pain in right ankle and joints of right foot  Difficulty in walking, not elsewhere classified  Muscle weakness (generalized)  Other abnormalities of gait and mobility  Stiffness of right ankle, not elsewhere classified     Problem List Patient Active Problem List   Diagnosis Date Noted  . Diabetes ([place]) 10/18/2019      Laureen Abrahams, PT, DPT 01/07/20 11:10 AM     [facility] Physical Therapy [address] [place], [place],  [address] Phone: [phone]   Fax:  [phone]  Name: Roxanne Panek MRN: [id number] Date of Birth: Sep 25, 1955

## 2020-01-13 ENCOUNTER — Ambulatory Visit: Payer: Medicare PPO | Admitting: Physical Therapy

## 2020-01-13 ENCOUNTER — Other Ambulatory Visit: Payer: Self-pay

## 2020-01-13 ENCOUNTER — Encounter: Payer: Self-pay | Admitting: Physical Therapy

## 2020-01-13 DIAGNOSIS — R2689 Other abnormalities of gait and mobility: Secondary | ICD-10-CM | POA: Diagnosis not present

## 2020-01-13 DIAGNOSIS — R262 Difficulty in walking, not elsewhere classified: Secondary | ICD-10-CM

## 2020-01-13 DIAGNOSIS — M25671 Stiffness of right ankle, not elsewhere classified: Secondary | ICD-10-CM

## 2020-01-13 DIAGNOSIS — M25571 Pain in right ankle and joints of right foot: Secondary | ICD-10-CM | POA: Diagnosis not present

## 2020-01-13 DIAGNOSIS — M6281 Muscle weakness (generalized): Secondary | ICD-10-CM

## 2020-01-13 NOTE — Therapy (Signed)
[facility] Physical Therapy [address] [place], [place], [address] Phone: [phone]   Fax:  [phone]  Physical Therapy Treatment/Discharge Summary  Patient Details  Name: Rachel Ayers MRN: [id number] Date of Birth: 11/07/1955 Referring Provider (PT): Dwana Melena, [place]   Encounter Date: 01/13/2020   PT End of Session - 01/13/20 1148    Visit Number 12    Number of Visits 12    Date for PT Re-Evaluation 01/10/20    Authorization Time Period 6-11-8/11    PT Start Time 1100    PT Stop Time 1140    PT Time Calculation (min) 40 min    Activity Tolerance No increased pain;Patient tolerated treatment well    Behavior During Therapy [facility] for tasks assessed/performed           History reviewed. No pertinent past medical history.  History reviewed. No pertinent surgical history.  There were no vitals filed for this visit.   Subjective Assessment - 01/13/20 1058    Subjective doing okay today.  wore tennis shoes today.    Pertinent History left hip replacement 2018, HTN, DM, stage 3 kidney disease    Limitations Walking;Standing    How long can you stand comfortably? 60 minutes    How long can you walk comfortably? 20-30 min; on average up to 8/10    Patient Stated Goals Walk without pain    Currently in Pain? Yes    Pain Score 4     Pain Location Ankle    Pain Orientation Right    Pain Descriptors / Indicators Aching;Sore    Pain Type Chronic pain    Pain Radiating Towards Achilles    Pain Onset More than a month ago    Pain Frequency Intermittent    Aggravating Factors  overuse, lack of HEP compliance              [facility] PT Assessment - 01/13/20 1122      Assessment   Medical Diagnosis R achilles tendonitis    Referring Provider (PT) Dwana Melena, PA-C      AROM   Right Ankle Dorsiflexion 0   Passive 3; 18 deg in weight bearing                        [facility] Adult PT Treatment/Exercise - 01/13/20 1105       Manual Therapy   Soft tissue mobilization IASTM with BioFreeze to achilles/gastroc/soleus on right      Ankle Exercises: Stretches   Soleus Stretch 5 reps;20 seconds    Slant Board Stretch 5 reps;20 seconds   bent and straight knees     Ankle Exercises: Standing   Heel Raises 20 reps   bil concentric; RLE eccentric   Other Standing Ankle Exercises able to perform 3 SL calf raises on RLE                    PT Short Term Goals - 12/19/19 1442      PT SHORT TERM GOAL #1   Title Perform BERG balance test and create LTG if appropriate.    Baseline no fall risk identified    Time 2    Period Weeks    Status Deferred    Target Date 11/08/19             PT Long Term Goals - 01/13/20 1148      PT LONG TERM GOAL #1   Title Pt will be  independent in her HEP and progression.    Time 6    Period Weeks    Status Achieved      PT LONG TERM GOAL #2   Title Pt will be able to amb 30 minutes with pain </= 3/10 in R achilles.    Baseline reports pain up to 8/10 with walking > 20 min; hasn't really tried 30 min    Time 6    Period Weeks    Status Not Met      PT LONG TERM GOAL #3   Title Pt will improve her R DF to >/= 10 degrees to assit with normalized gait and heel strike.    Baseline met in standing    Time 6    Period Weeks    Status Partially Met      PT LONG TERM GOAL #4   Title Adisen will be able to complete a single leg heel raise on the R.    Baseline Unable at evaluation.    Status Achieved                 Plan - 01/13/20 1149    Clinical Impression Statement Pt has met/partially met all LTGs except walking tolerance.  This is still highly variable.  Trialed heel lift today with decreased pain with walking so provided to pt to try for increasing endurance and distance tolerance.  Overall doing well, and needs consistent HEP to work on symptoms.  Ready for d/c today.    Personal Factors and Comorbidities Comorbidity 3+    Comorbidities DM, R hip  replacement, HTN, R rotator cuff injury, L elbow injury due to fall, recent history of 2 falls, L ankle sprain. kidney disease stage 3.    Examination-Activity Limitations Squat;Stairs;Stand;Other    Stability/Clinical Decision Making Stable/Uncomplicated    Rehab Potential Good    PT Frequency 2x / week    PT Duration 6 weeks    PT Treatment/Interventions Cryotherapy;Electrical Stimulation;Iontophoresis 26m/ml Dexamethasone;Moist Heat;Ultrasound;Gait training;Stair training;Functional mobility training;Neuromuscular re-education;Balance training;Therapeutic exercise;Therapeutic activities;Patient/family education;Orthotic Fit/Training;Taping;Dry needling;Manual techniques    PT Next Visit Plan d/c PT today    PT Home Exercise Plan Upper and lower heels cords stretching along with calf muscle strengthening.    Consulted and Agree with Plan of Care Patient           Patient will benefit from skilled therapeutic intervention in order to improve the following deficits and impairments:  Pain, Postural dysfunction, Decreased strength, Decreased activity tolerance, Decreased range of motion, Difficulty walking, Abnormal gait, Decreased balance  Visit Diagnosis: Pain in right ankle and joints of right foot  Difficulty in walking, not elsewhere classified  Muscle weakness (generalized)  Other abnormalities of gait and mobility  Stiffness of right ankle, not elsewhere classified     Problem List Patient Active Problem List   Diagnosis Date Noted   Diabetes ([place] 10/18/2019      SLaureen Abrahams PT, DPT 01/13/20 11:51 AM    [facility] [address] [address]Phone: [phone]  Fax:  [phone] Name: Rachel GeislerMRN: [id number] of Birth: 5December 06, 1957     PHYSICAL THERAPY DISCHARGE SUMMARY  Visits from Start of Care: 12  Current functional level related to goals / functional outcomes: See  above   Remaining deficits: See above   Education / Equipment: HEP  Plan: Patient agrees to discharge.  Patient goals were partially met. Patient is being discharged due to  being pleased with the current functional level.  ?????    Pt has achieved maximal rehab potential.  Laureen Abrahams, PT, DPT 01/13/20 11:52 AM  [facility] Physical Therapy [address] [place], [place], [address] Phone: [phone]   Fax:  [phone]

## 2020-01-20 ENCOUNTER — Encounter: Payer: Medicare PPO | Admitting: Physical Therapy

## 2020-01-30 DIAGNOSIS — G4733 Obstructive sleep apnea (adult) (pediatric): Secondary | ICD-10-CM | POA: Diagnosis not present

## 2020-01-30 DIAGNOSIS — F321 Major depressive disorder, single episode, moderate: Secondary | ICD-10-CM | POA: Diagnosis not present

## 2020-01-30 DIAGNOSIS — Z79899 Other long term (current) drug therapy: Secondary | ICD-10-CM | POA: Diagnosis not present

## 2020-01-30 DIAGNOSIS — Z0000 Encounter for general adult medical examination without abnormal findings: Secondary | ICD-10-CM | POA: Diagnosis not present

## 2020-01-30 DIAGNOSIS — E7800 Pure hypercholesterolemia, unspecified: Secondary | ICD-10-CM | POA: Diagnosis not present

## 2020-01-30 DIAGNOSIS — E2839 Other primary ovarian failure: Secondary | ICD-10-CM | POA: Diagnosis not present

## 2020-01-30 DIAGNOSIS — E1165 Type 2 diabetes mellitus with hyperglycemia: Secondary | ICD-10-CM | POA: Diagnosis not present

## 2020-01-30 DIAGNOSIS — Z1159 Encounter for screening for other viral diseases: Secondary | ICD-10-CM | POA: Diagnosis not present

## 2020-01-30 DIAGNOSIS — I25118 Atherosclerotic heart disease of native coronary artery with other forms of angina pectoris: Secondary | ICD-10-CM | POA: Diagnosis not present

## 2020-01-30 DIAGNOSIS — I10 Essential (primary) hypertension: Secondary | ICD-10-CM | POA: Diagnosis not present

## 2020-02-03 ENCOUNTER — Other Ambulatory Visit: Payer: Self-pay | Admitting: Family Medicine

## 2020-02-03 DIAGNOSIS — E2839 Other primary ovarian failure: Secondary | ICD-10-CM

## 2020-02-12 ENCOUNTER — Other Ambulatory Visit: Payer: Self-pay | Admitting: Family Medicine

## 2020-02-12 DIAGNOSIS — Z1231 Encounter for screening mammogram for malignant neoplasm of breast: Secondary | ICD-10-CM

## 2020-02-12 DIAGNOSIS — J4541 Moderate persistent asthma with (acute) exacerbation: Secondary | ICD-10-CM | POA: Diagnosis not present

## 2020-02-12 DIAGNOSIS — R0789 Other chest pain: Secondary | ICD-10-CM | POA: Diagnosis not present

## 2020-02-26 ENCOUNTER — Ambulatory Visit: Payer: Medicare PPO | Admitting: Internal Medicine

## 2020-02-26 ENCOUNTER — Encounter: Payer: Self-pay | Admitting: Internal Medicine

## 2020-02-26 ENCOUNTER — Other Ambulatory Visit: Payer: Self-pay

## 2020-02-26 VITALS — BP 130/80 | HR 61 | Ht 64.0 in | Wt 192.8 lb

## 2020-02-26 DIAGNOSIS — E1169 Type 2 diabetes mellitus with other specified complication: Secondary | ICD-10-CM | POA: Diagnosis not present

## 2020-02-26 DIAGNOSIS — E119 Type 2 diabetes mellitus without complications: Secondary | ICD-10-CM | POA: Diagnosis not present

## 2020-02-26 DIAGNOSIS — I10 Essential (primary) hypertension: Secondary | ICD-10-CM | POA: Diagnosis not present

## 2020-02-26 DIAGNOSIS — R079 Chest pain, unspecified: Secondary | ICD-10-CM | POA: Diagnosis not present

## 2020-02-26 DIAGNOSIS — I5181 Takotsubo syndrome: Secondary | ICD-10-CM | POA: Diagnosis not present

## 2020-02-26 DIAGNOSIS — I491 Atrial premature depolarization: Secondary | ICD-10-CM | POA: Insufficient documentation

## 2020-02-26 DIAGNOSIS — I493 Ventricular premature depolarization: Secondary | ICD-10-CM | POA: Insufficient documentation

## 2020-02-26 NOTE — Patient Instructions (Signed)
Medication Instructions:  Your physician recommends that you continue on your current medications as directed. Please refer to the Current Medication list given to you today.  *If you need a refill on your cardiac medications before your next appointment, please call your pharmacy*   Lab Work: None If you have labs (blood work) drawn today and your tests are completely normal, you will receive your results only by: Marland Kitchen MyChart Message (if you have MyChart) OR . A paper copy in the mail If you have any lab test that is abnormal or we need to change your treatment, we will call you to review the results.   Testing/Procedures: Your physician has requested that you have an echocardiogram. Echocardiography is a painless test that uses sound waves to create images of your heart. It provides your doctor with information about the size and shape of your heart and how well your heart's chambers and valves are working. This procedure takes approximately one hour. There are no restrictions for this procedure.     Follow-Up: At [facility], you and your health needs are our priority.  As part of our continuing mission to provide you with exceptional heart care, we have created designated Provider Care Teams.  These Care Teams include your primary Cardiologist (physician) and Advanced Practice Providers (APPs -  Physician Assistants and Nurse Practitioners) who all work together to provide you with the care you need, when you need it.  We recommend signing up for the patient portal called "MyChart".  Sign up information is provided on this After Visit Summary.  MyChart is used to connect with patients for Virtual Visits (Telemedicine).  Patients are able to view lab/test results, encounter notes, upcoming appointments, etc.  Non-urgent messages can be sent to your provider as well.   To learn more about what you can do with MyChart, go to [email or web address].    Your next appointment:   4  month(s)  The format for your next appointment:   In Person  Provider:   Marlan Palau, MD    Other Instructions None

## 2020-02-26 NOTE — Progress Notes (Signed)
Cardiology Office Note:    Date:  02/26/2020   ID:  Rachel Ayers, DOB 07/20/1955, MRN [id number]  PCP:  Lujean Amel, [place] Cardiologist:  Werner Lean, MD  [place] Electrophysiologist:  None   Referring MD: Lujean Amel, MD   [id number] Pain Consulted for the evaluation of chest pain at the behest of Koir[place], [place], MD  History of Present Illness:    Rachel Ayers is a 64 y.o. female with a hx of Takotsubo Cardiomyopathy (Patent LHC 2010 at 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   Patient notes that when she has frequent PVCs that she had significant PVCs (very symptomatic when they occur).  Patient is working to lose weight.  With swimming had some wheezing in the pool (has hx of asthma).  Felt heart squeeze.  Stress induced cardiomyopathy has anginal chest pain/chest pressure.  With stress No SOB but feels more short of breath when she was in [place] and at higher elevation; this has resolved. No clear exercise or stress trigger.  Past Medical History:  Diagnosis Date  . CAD (coronary artery disease)   . Depression   . DM (diabetes mellitus) ([place])   . Estrogen deficiency   . HTN (hypertension)   . Hypercholesteremia   . MI (myocardial infarction) ([place])   . Morbid obesity ([place])   . OSA (obstructive sleep apnea)    No past surgical history on file.  Current Medications: Current Meds  Medication Sig  . Accu-Chek Softclix Lancets lancets 1 each by Other route daily. E11.9  . albuterol (VENTOLIN HFA) 108 (90 Base) MCG/ACT inhaler Inhale 2 puffs into the lungs every 6 (six) hours as needed for wheezing or shortness of breath.  . Blood Glucose Monitoring Suppl (ACCU-CHEK GUIDE ME) w/Device KIT 1 each by Does not apply route daily. E11.9  .  budesonide-formoterol (SYMBICORT) 160-4.5 MCG/ACT inhaler Inhale 2 puffs into the lungs 2 (two) times daily.  Marland Kitchen buPROPion (WELLBUTRIN SR) 200 MG 12 hr tablet Take 200 mg by mouth daily.  Marland Kitchen escitalopram (LEXAPRO) 20 MG tablet Take 20 mg by mouth daily.  Marland Kitchen glucose blood (ACCU-CHEK GUIDE) test strip 1 each by Other route daily. E11.9  . hydrALAZINE (APRESOLINE) 25 MG tablet Take 25 mg by mouth 2 (two) times daily.  . Insulin Pen Needle (PEN NEEDLES) 32G X 4 MM MISC by Does not apply route.  . lansoprazole (PREVACID) 15 MG capsule Take 15 mg by mouth daily at 12 noon.  . liraglutide (VICTOZA) 18 MG/3ML SOPN Inject 0.6 mg into the skin daily.   Marland Kitchen METOPROLOL SUCCINATE ER PO Take 25 mg by mouth daily.   . MULTIPLE VITAMIN PO Take 1 tablet by mouth daily.  . Semaglutide (RYBELSUS) 7 MG TABS Take 7 mg by mouth daily.  . simvastatin (ZOCOR) 40 MG tablet Take 40 mg by mouth daily.    Allergies:   Sulfa antibiotics   Social History   Socioeconomic History  . Marital status: Divorced    Spouse name: Not on file  . Number of children: Not on file  . Years of education: Not on file  . Highest education level: Not on file  Occupational History  . Not on file  Tobacco Use  . Smoking status: Never Smoker  . Smokeless tobacco: Never Used  Substance and Sexual Activity  .  Alcohol use: Never  . Drug use: Never  . Sexual activity: Never  Other Topics Concern  . Not on file  Social History Narrative  . Not on file   Social Determinants of Health   Financial Resource Strain:   . Difficulty of Paying Living Expenses: Not on file  Food Insecurity:   . Worried About Charity fundraiser in the Last Year: Not on file  . Ran Out of Food in the Last Year: Not on file  Transportation Needs:   . Lack of Transportation (Medical): Not on file  . Lack of Transportation (Non-Medical): Not on file  Physical Activity:   . Days of Exercise per Week: Not on file  . Minutes of Exercise per Session: Not on  file  Stress:   . Feeling of Stress : Not on file  Social Connections:   . Frequency of Communication with Friends and Family: Not on file  . Frequency of Social Gatherings with Friends and Family: Not on file  . Attends Religious Services: Not on file  . Active Member of Clubs or Organizations: Not on file  . Attends Archivist Meetings: Not on file  . Marital Status: Not on file    Family History: The patient's family history includes Diabetes in her father. Sister had Mi at age 98 (also had tobacco use) Father had heart disease NOS.  ROS:   Please see the history of present illness.    All other systems reviewed and are negative.  EKGs/Labs/Other Studies Reviewed:    The following studies were reviewed today:  EKG:  EKG is ordered today.  The ekg ordered today demonstrates Sinus rhythm rate of 60 normal axis, q wave in V1, borderline criteria for anterior infarct  [place]: PVC and PACs on last Holter Monitor OSH LCP:03/22/09- EF 40% LVEDP 18, Nondominant Circ; Large RCA (dominant vessel) widely patent coronary arteries OSH Echo 2010:  Mild AS, Mild MR, Apical Hypokinesis, EF 45% (report only).  A1c 6.2 Creatine 1.31 and GFR 49 AST/ALT 26, 24 LDL 78 02/2020  Physical Exam:    VS:  BP 130/80   Pulse 61   Ht _0  (1.626 m)   Wt 192 lb 12.8 oz (87.5 kg)   SpO2 96%   BMI 33.09 kg/m     Wt Readings from Last 3 Encounters:  02/26/20 192 lb 12.8 oz (87.5 kg)  10/15/19 212 lb (96.2 kg)  10/03/19 215 lb (97.5 kg)    GEN: Obese, well developed in no acute distress HEENT: Normal NECK: No JVD; No carotid bruits LYMPHATICS: No lymphadenopathy CARDIAC: RRR, no murmurs, rubs, gallops RESPIRATORY:  Clear to auscultation without rales, wheezing or rhonchi  ABDOMEN: Soft, non-tender, non-distended MUSCULOSKELETAL:  No edema; No deformity  SKIN: Warm and dry NEUROLOGIC:  Alert and oriented x 3 PSYCHIATRIC:  Normal affect   ASSESSMENT:    1. Takotsubo  cardiomyopathy   2. PVC (premature ventricular contraction)   3. PAC (premature atrial contraction)   4. Diabetes mellitus with coincident hypertension ([place])   5. Type 2 diabetes mellitus with other specified complication, without long-term current use of insulin (HCC)    PLAN:    In order of problems listed above:  1. Takostubo Cardiomyopathy with atypical chest pain on BB - Old records [place] (2010 LHC, 07/27/2018 ABI); will get  Echo 2. PVCs and PACs - stable on metoprolol succinate; continue current regimen 3. Diabetes with HTN -stable, continue current regimin 4. HLD - stable,  continue simvastatin  6.  Morbid Obesity (BMI 36 with HTN, HLD, DM) - discussed exercise and diet options; lost 20 lbs on Victoza thus far (swims 1 mile three days a week)  4 months follow   Medication Adjustments/Labs and Tests Ordered: Current medicines are reviewed at length with the patient today.  Concerns regarding medicines are outlined above.  No orders of the defined types were placed in this encounter.  No orders of the defined types were placed in this encounter.   There are no Patient Instructions on file for this visit.   Signed, Werner Lean, MD  02/26/2020 2:33 PM    [place]

## 2020-02-27 DIAGNOSIS — Z01419 Encounter for gynecological examination (general) (routine) without abnormal findings: Secondary | ICD-10-CM | POA: Diagnosis not present

## 2020-03-04 DIAGNOSIS — G4733 Obstructive sleep apnea (adult) (pediatric): Secondary | ICD-10-CM | POA: Diagnosis not present

## 2020-03-05 DIAGNOSIS — H5203 Hypermetropia, bilateral: Secondary | ICD-10-CM | POA: Diagnosis not present

## 2020-03-05 DIAGNOSIS — H52203 Unspecified astigmatism, bilateral: Secondary | ICD-10-CM | POA: Diagnosis not present

## 2020-03-05 DIAGNOSIS — E119 Type 2 diabetes mellitus without complications: Secondary | ICD-10-CM | POA: Diagnosis not present

## 2020-03-09 ENCOUNTER — Ambulatory Visit (HOSPITAL_COMMUNITY): Payer: Medicare PPO | Attending: Internal Medicine

## 2020-03-09 ENCOUNTER — Other Ambulatory Visit: Payer: Self-pay

## 2020-03-09 ENCOUNTER — Telehealth: Payer: Self-pay | Admitting: Internal Medicine

## 2020-03-09 DIAGNOSIS — I5181 Takotsubo syndrome: Secondary | ICD-10-CM | POA: Insufficient documentation

## 2020-03-09 LAB — ECHOCARDIOGRAM COMPLETE
Area-P 1/2: 2.69 cm2
S' Lateral: 1.6 cm

## 2020-03-09 MED ORDER — PERFLUTREN LIPID MICROSPHERE
1.0000 mL | INTRAVENOUS | Status: AC | PRN
  Administered 2020-03-09: 2 mL via INTRAVENOUS

## 2020-03-09 NOTE — Telephone Encounter (Signed)
Fax ROI to [facility] requesting  patient records.

## 2020-03-10 NOTE — Addendum Note (Signed)
Addended by: MENDOZA MENDEZ, Kamrin Sibley A on: 03/10/2020 05:15 PM   Modules accepted: Orders  

## 2020-03-11 ENCOUNTER — Telehealth: Payer: Self-pay | Admitting: Internal Medicine

## 2020-03-11 DIAGNOSIS — I5181 Takotsubo syndrome: Secondary | ICD-10-CM

## 2020-03-11 MED ORDER — DIAZEPAM 5 MG PO TABS: 5.0000 mg | ORAL_TABLET | Freq: Once | ORAL | 0 refills | Status: DC | PRN

## 2020-03-11 NOTE — Telephone Encounter (Signed)
Discussed results of the echocardiogram:  Patient has normal LV function and normal Base RV function.  Mid and apex of RV appears hypokinetic in some views and is not well shown in general.  Discussed spectrum of options from conservative monitoring of symptoms to eval of RV pressures.  Would recommend CMR for assessment of RV.  Discussed that sedating medications could decrease her ability to breath-hold and could lead free-breathing studies with decreased accuracy.  Can attempt low dose anxiolytic prior.   Patient amenable to attempt limited CMR focusing on RV Size and function (ARVC protocol could be used).  Patient to start slowly increasing her exercise back.  No further questions.  Riley Lam, MD [facility]  [address] [place], [address] [place], [place] [address] [phone]  9:40 AM

## 2020-03-11 NOTE — Telephone Encounter (Signed)
Patient returned call, transferred to [facility]

## 2020-03-11 NOTE — Telephone Encounter (Signed)
Called to speak with patient regarding preferred weekdays and time for scheduling the Cardiac MRI ordered by Dr. Izora Ribas.  Voice mail was not set up---will try calling patient at a later time

## 2020-03-11 NOTE — Addendum Note (Signed)
Addended by: Riley Lam A on: 03/11/2020 10:03 AM   Modules accepted: Orders

## 2020-03-12 ENCOUNTER — Encounter: Payer: Self-pay | Admitting: Internal Medicine

## 2020-03-12 NOTE — Telephone Encounter (Signed)
Spoke with patient regarding appointment for Cardiac MRI scheduled Tuesday 03/31/20 at 12:00pm at [facility]---arrival time is 11:30am --1st floor admissions office.  Will mail information to patient and she  Voiced her understanding.

## 2020-03-17 NOTE — Addendum Note (Signed)
Addended by: Riley Lam A on: 03/17/2020 08:37 PM   Modules accepted: Orders

## 2020-03-20 DIAGNOSIS — K219 Gastro-esophageal reflux disease without esophagitis: Secondary | ICD-10-CM | POA: Diagnosis not present

## 2020-03-20 DIAGNOSIS — Z1211 Encounter for screening for malignant neoplasm of colon: Secondary | ICD-10-CM | POA: Diagnosis not present

## 2020-03-20 DIAGNOSIS — R140 Abdominal distension (gaseous): Secondary | ICD-10-CM | POA: Diagnosis not present

## 2020-03-30 ENCOUNTER — Telehealth (HOSPITAL_COMMUNITY): Payer: Self-pay | Admitting: Emergency Medicine

## 2020-03-30 NOTE — Telephone Encounter (Signed)
Pt returning phone call regarding upcoming cardiac imaging study; pt verbalizes understanding of appt date/time, parking situation and where to check in,and verified current allergies; name and call back number provided for further questions should they arise Rachel Alexandria RN Navigator Cardiac Imaging [facility] [phone] office [phone] cell   Pt given one time dose 5mg  valium to take prior to scan for claustrophobia Pt denies implants other than orthopedic (hip replacement)

## 2020-03-30 NOTE — Telephone Encounter (Signed)
VM box full

## 2020-03-31 ENCOUNTER — Other Ambulatory Visit: Payer: Self-pay

## 2020-03-31 ENCOUNTER — Ambulatory Visit (HOSPITAL_COMMUNITY)
Admission: RE | Admit: 2020-03-31 | Discharge: 2020-03-31 | Disposition: A | Discharge status: Home / Self Care | Payer: Medicare PPO | Source: Ambulatory Visit | Attending: Internal Medicine | Admitting: Internal Medicine

## 2020-03-31 DIAGNOSIS — I5181 Takotsubo syndrome: Secondary | ICD-10-CM | POA: Diagnosis not present

## 2020-03-31 IMAGING — MR MR CARD MORPHOLOGY WO/W CM
45 of 48 series · 45 of 48 positions shown · IV contrast (Contrast agent)
Comparison: none

CLINICAL DATA: 64 year old female with difficult RV assessment on
echocardiogram

EXAM:
CARDIAC MRI
TECHNIQUE: The patient was scanned on a 1.5 Tesla GE magnet. A dedicated
cardiac coil was used. Functional imaging was done using Fiesta
sequences. [DATE], and 4 chamber views were done to assess for RWMA's.
Modified GENTELMAN rule using a short axis stack was used to
calculate an ejection fraction on a dedicated work station using
Circle software. The patient received 10 cc of Gadavist. After 10
minutes inversion recovery sequences were used to assess for
infiltration and scar tissue.
CONTRAST:  10 cc  of Gadavist

[Series 4: t2_[id number]_bh · axial · 8.0mm · 1.56mm/px · 1 of 20 slices shown]
[im 1/20]
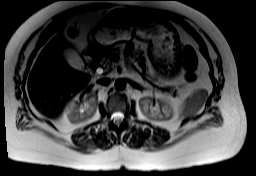

[Series 8: bSSFP · oblique · 8.0mm · 1.61mm/px · 1 of 25 slices shown (1 of 20)]
[im 1/25]
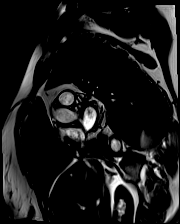

[Series 9: bSSFP · oblique · 8.0mm · 1.61mm/px · 1 of 25 slices shown (2 of 20)]
[im 1/25]
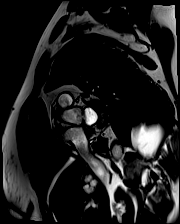

[Series 10: bSSFP · oblique · 8.0mm · 1.61mm/px · 1 of 25 slices shown (3 of 20)]
[im 1/25]
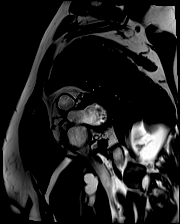

[Series 11: bSSFP · oblique · 8.0mm · 1.61mm/px · 1 of 25 slices shown (4 of 20)]
[im 1/25]
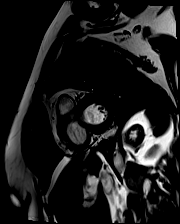

[Series 12: bSSFP · oblique · 8.0mm · 1.61mm/px · 1 of 25 slices shown (5 of 20)]
[im 1/25]
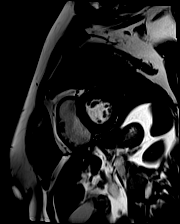

[Series 13: bSSFP · oblique · 8.0mm · 1.61mm/px · 1 of 25 slices shown (6 of 20)]
[im 1/25]
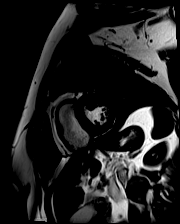

[Series 14: bSSFP · oblique · 8.0mm · 1.61mm/px · 1 of 25 slices shown (7 of 20)]
[im 1/25]
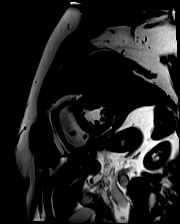

[Series 15: bSSFP · oblique · 8.0mm · 1.61mm/px · 1 of 25 slices shown (8 of 20)]
[im 1/25]
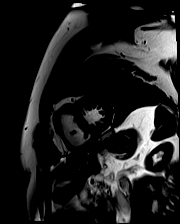

[Series 16: bSSFP · oblique · 8.0mm · 1.61mm/px · 1 of 25 slices shown (9 of 20)]
[im 1/25]
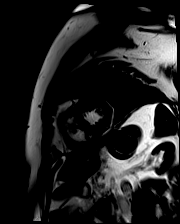

[Series 17: bSSFP · oblique · 8.0mm · 1.61mm/px · 1 of 25 slices shown (10 of 20)]
[im 1/25]
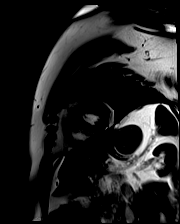

[Series 18: bSSFP · oblique · 8.0mm · 1.61mm/px · 1 of 25 slices shown (11 of 20)]
[im 1/25]
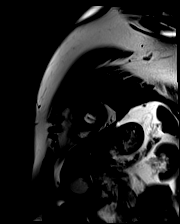

[Series 19: bSSFP · oblique · 8.0mm · 1.61mm/px · 1 of 25 slices shown (12 of 20)]
[im 1/25]
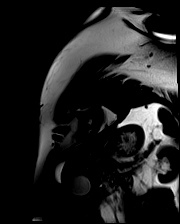

[Series 20: bSSFP · oblique · 8.0mm · 1.61mm/px · 1 of 25 slices shown (13 of 20)]
[im 1/25]
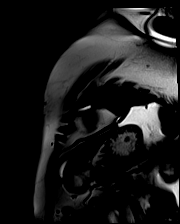

[Series 21: bSSFP · oblique · 8.0mm · 1.61mm/px · 1 of 25 slices shown (14 of 20)]
[im 1/25]
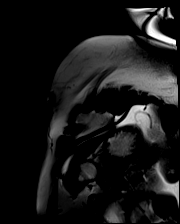

[Series 22: bSSFP · oblique · 8.0mm · 1.61mm/px · 1 of 25 slices shown (15 of 20)]
[im 1/25]
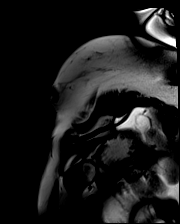

[Series 23: bSSFP · oblique · 6.0mm · 1.41mm/px · 1 of 25 slices shown (16 of 20)]
[im 1/25]
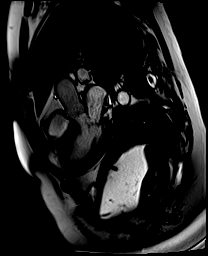

[Series 24: bSSFP · oblique · 6.0mm · 1.41mm/px · 1 of 25 slices shown (17 of 20)]
[im 1/25]
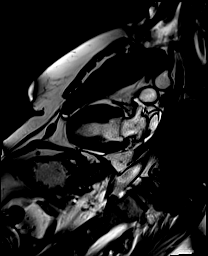

[Series 25: bSSFP · axial · 6.0mm · 1.41mm/px · 1 of 25 slices shown (18 of 20)]
[im 1/25]
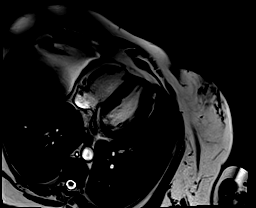

[Series 27: bSSFP · oblique · 6.0mm · 1.41mm/px · 1 of 25 slices shown (19 of 20)]
[im 1/25]
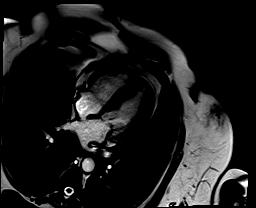

[Series 30: (id)_long_t1 · oblique · 8.0mm · 1.56mm/px · 1 of 24 slices shown]
[im 1/24]
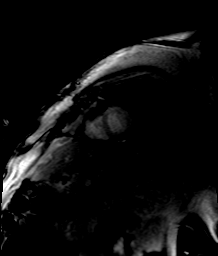

[Series 31: (id)_long_t1_moco · oblique · 8.0mm · 1.56mm/px · 1 of 24 slices shown]
[im 1/24]
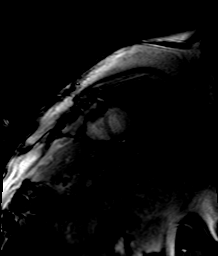

[Series 32: (id)_long_t1_moco_t1 · oblique · 8.0mm · 1.56mm/px · 1 of 6 slices shown]
[im 1/6]
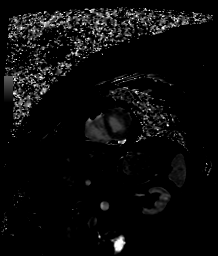

[Series 34: (id)_trufi · oblique · 8.0mm · 2.08mm/px · 1 of 9 slices shown]
[im 1/9]
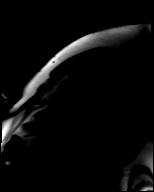

[Series 35: (id)_trufi_moco · oblique · 8.0mm · 2.08mm/px · 1 of 9 slices shown]
[im 1/9]
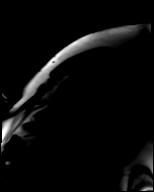

[Series 36: (id)_trufi_moco_t2 · oblique · 8.0mm · 2.08mm/px · 1 of 3 slices shown]
[im 1/3]
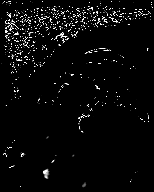

[Series 38: STIR · oblique · 8.0mm · 1.92mm/px · 1 of 17 slices shown]
[im 1/17]
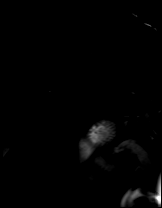

[Series 48: pre short axis · oblique · non-contrast · 8.0mm · 2.25mm/px · 1 of 10 slices shown (1 of 6)]
[im 1/10]
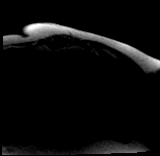

[Series 49: pre short axis · oblique · non-contrast · 8.0mm · 2.25mm/px · 1 of 10 slices shown (2 of 6)]
[im 1/10]
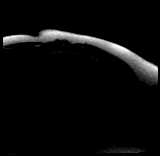

[Series 50: pre short axis · oblique · non-contrast · 8.0mm · 2.25mm/px · 1 of 10 slices shown (3 of 6)]
[im 1/10]
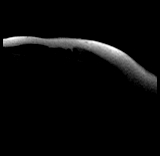

[Series 51: pre short axis · oblique · non-contrast · 8.0mm · 2.25mm/px · 1 of 10 slices shown (4 of 6)]
[im 1/10]
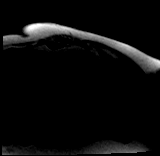

[Series 52: pre short axis · oblique · non-contrast · 8.0mm · 2.25mm/px · 1 of 10 slices shown (5 of 6)]
[im 1/10]
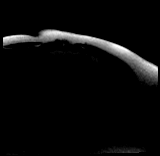

[Series 53: pre short axis · oblique · non-contrast · 8.0mm · 2.25mm/px · 1 of 10 slices shown (6 of 6)]
[im 1/10]
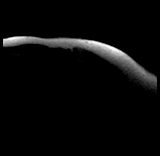

[Series 54: rest short axis · oblique · 8.0mm · 2.25mm/px · 1 of 80 slices shown (1 of 6)]
[im 1/80]
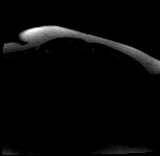

[Series 55: rest short axis · oblique · 8.0mm · 2.25mm/px · 1 of 80 slices shown (2 of 6)]
[im 1/80]
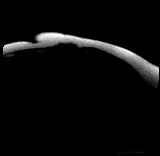

[Series 56: rest short axis · oblique · 8.0mm · 2.25mm/px · 1 of 80 slices shown (3 of 6)]
[im 1/80]
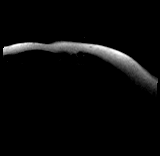

[Series 57: rest short axis · oblique · 8.0mm · 2.25mm/px · 1 of 80 slices shown (4 of 6)]
[im 1/80]
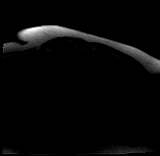

[Series 58: rest short axis · oblique · 8.0mm · 2.25mm/px · 1 of 80 slices shown (5 of 6)]
[im 1/80]
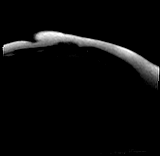

[Series 59: rest short axis · oblique · 8.0mm · 2.25mm/px · 1 of 80 slices shown (6 of 6)]
[im 1/80]
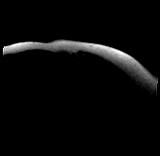

[Series 60: bSSFP · coronal · 6.0mm · 1.41mm/px · 1 of 25 slices shown (20 of 20)]
[im 1/25]
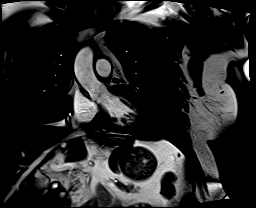

[Series 61: cine rvit · oblique · 6.0mm · 1.41mm/px · 1 of 25 slices shown]
[im 1/25]
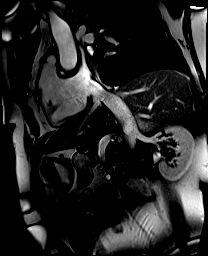

[Series 62: aortic valve cine · oblique · 6.0mm · 1.41mm/px · 1 of 25 slices shown]
[im 1/25]
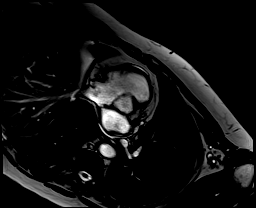

[Series 63: cine rvot · sagittal · 6.0mm · 1.41mm/px · 1 of 25 slices shown]
[im 1/25]
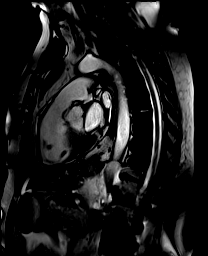

[Series 65: lge_single shot sa · oblique · 8.0mm · 2.08mm/px · 1 of 15 slices shown (1 of 2)]
[im 1/15]
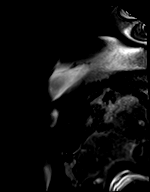

[Series 66: lge_single shot sa · oblique · 8.0mm · 2.08mm/px · 1 of 15 slices shown (2 of 2)]
[im 1/15]
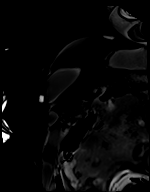

[45 of 48 positions shown; findings below may reference images not displayed]

FINDINGS: 1. Normal left ventricular size, LVEDVI 38 ml/m2. Mild, concentric
LV thickness with normal LV Mass (GENTELMAN mass/BSA 59 g/m2).
Hyperdynamic LV systolic function (LVEF =74%). There are no regional
wall motion abnormalities.

There is no late gadolinium enhancement in the left ventricular
myocardium.

2. Normal right ventricular size and thickness, RVEDVI 46 ml/m2.
Normal RV systolic function (RVEF =53%). There are no regional wall
motion abnormalities or aneurysms. No criteria for ARVC met.

3. Normal left and right atrial size. Evidence of atrial septal
aneurysm.

4. Normal size of the aortic root, ascending aorta and pulmonary
artery.

5.  No significant valvular abnormalities.

6.  Normal pericardium.  No pericardial effusion.
IMPRESSION: Normal biventricular function without evidence of late gadolinium
enhancement.

No criteria for ARVC met.

GENTELMAN

## 2020-03-31 MED ORDER — GADOBUTROL 1 MMOL/ML IV SOLN
10.0000 mL | Freq: Once | INTRAVENOUS | Status: AC | PRN
  Administered 2020-03-31: 10 mL via INTRAVENOUS

## 2020-04-01 ENCOUNTER — Telehealth: Payer: Self-pay | Admitting: Internal Medicine

## 2020-04-01 NOTE — Telephone Encounter (Signed)
    Pt is calling back she said she have a question about her MRI result

## 2020-04-01 NOTE — Telephone Encounter (Signed)
I s/w the pt who had a question about the MRI results that we went over this morning. Question was in regards #3 on MRI results, Normal left and right atrial size. Evidence of atrial septal aneurysm. I explained to the pt that this means there is a slight enlargement. I assured the pt that we will keep an eye on this for her and generally will repeat test again in a year or two. I assured the pt if the MD felt was of an urgent matter we will bring her in the office to discuss further. Pt thanked me for the call back.

## 2020-04-10 ENCOUNTER — Ambulatory Visit: Payer: Medicare PPO | Admitting: Orthopaedic Surgery

## 2020-04-10 ENCOUNTER — Encounter: Payer: Self-pay | Admitting: Orthopaedic Surgery

## 2020-04-10 ENCOUNTER — Other Ambulatory Visit: Payer: Self-pay

## 2020-04-10 VITALS — Ht 64.0 in | Wt 189.0 lb

## 2020-04-10 DIAGNOSIS — S86819A Strain of other muscle(s) and tendon(s) at lower leg level, unspecified leg, initial encounter: Secondary | ICD-10-CM | POA: Insufficient documentation

## 2020-04-10 DIAGNOSIS — S86812A Strain of other muscle(s) and tendon(s) at lower leg level, left leg, initial encounter: Secondary | ICD-10-CM

## 2020-04-10 MED ORDER — CYCLOBENZAPRINE HCL 5 MG PO TABS: 5.0000 mg | ORAL_TABLET | Freq: Three times a day (TID) | ORAL | 3 refills | Status: DC | PRN

## 2020-04-10 NOTE — Progress Notes (Signed)
Office Visit Note   Patient: Rachel Ayers           Date of Birth: 02-17-1956           MRN: [id number] Visit Date: 04/10/2020              Requested by: Darrow Bussing, MD [address] [place],  [place] [address] PCP: Darrow Bussing, MD   Assessment & Plan: Visit Diagnoses:  1. Strain of calf muscle, left, initial encounter     Plan: Impression is left calf strain/partial tear.  We will mobilize in a cam boot for weightbearing.  Prescription for Flexeril.  Patient cannot take NSAIDs due to heart and kidney disease.  Recheck in 4 weeks.  Follow-Up Instructions: Return in about 4 weeks (around 05/08/2020).   Orders:  No orders of the defined types were placed in this encounter.  Meds ordered this encounter  Medications  . cyclobenzaprine (FLEXERIL) 5 MG tablet    Sig: Take 1-2 tablets (5-10 mg total) by mouth 3 (three) times daily as needed for muscle spasms.    Dispense:  30 tablet    Refill:  3      Procedures: No procedures performed   Clinical Data: No additional findings.   Subjective: Chief Complaint  Patient presents with  . Left Leg - Pain, Injury    DOI 04/09/2020    Rachel Ayers comes in today for evaluation of new injury to her left calf while stretching yesterday morning.  She felt a pop and since then she has had pain in the calf muscle and she is unable to push off with her foot.  She has not noticed any swelling or bruising.  Denies any numbness and tingling.   Review of Systems  Constitutional: Negative.   HENT: Negative.   Eyes: Negative.   Respiratory: Negative.   Cardiovascular: Negative.   Endocrine: Negative.   Musculoskeletal: Negative.   Neurological: Negative.   Hematological: Negative.   Psychiatric/Behavioral: Negative.   All other systems reviewed and are negative.    Objective: Vital Signs: Ht 5\' 4"  (1.626 m)   Wt 189 lb (85.7 kg)   BMI 32.44 kg/m   Physical Exam Vitals and nursing note  reviewed.  Constitutional:      Appearance: She is well-developed.  Pulmonary:     Effort: Pulmonary effort is normal.  Skin:    General: Skin is warm.     Capillary Refill: Capillary refill takes less than 2 seconds.  Neurological:     Mental Status: She is alert and oriented to person, place, and time.  Psychiatric:        Behavior: Behavior normal.        Thought Content: Thought content normal.        Judgment: Judgment normal.     Ortho Exam Left calf shows no asymmetry or bruising.  She is quite tender to the musculotendinous junction of the medial gastroc.  Achilles tendon is in continuity and is nontender.  Thompson's test.  Popliteal fossa is nontender.  Good range of motion of the ankle and the knee. Specialty Comments:  No specialty comments available.  Imaging: No results found.   PMFS History: Patient Active Problem List   Diagnosis Date Noted  . Strain of calf muscle 04/10/2020  . Takotsubo cardiomyopathy 02/26/2020  . PVC (premature ventricular contraction) 02/26/2020  . PAC (premature atrial contraction) 02/26/2020  . Chest pain of uncertain etiology 02/26/2020  . Diabetes mellitus with  coincident hypertension (HCC) 10/18/2019   Past Medical History:  Diagnosis Date  . CAD (coronary artery disease)   . Depression   . DM (diabetes mellitus) (HCC)   . Estrogen deficiency   . HTN (hypertension)   . Hypercholesteremia   . MI (myocardial infarction) (HCC)   . Morbid obesity (HCC)   . OSA (obstructive sleep apnea)     Family History  Problem Relation Age of Onset  . Diabetes Father     History reviewed. No pertinent surgical history. Social History   Occupational History  . Not on file  Tobacco Use  . Smoking status: Never Smoker  . Smokeless tobacco: Never Used  Substance and Sexual Activity  . Alcohol use: Never  . Drug use: Never  . Sexual activity: Never

## 2020-05-05 ENCOUNTER — Emergency Department (HOSPITAL_COMMUNITY): Payer: Medicare PPO

## 2020-05-05 ENCOUNTER — Emergency Department (HOSPITAL_COMMUNITY)
Admission: EM | Admit: 2020-05-05 | Discharge: 2020-05-06 | Disposition: A | Discharge status: Home / Self Care | Payer: Medicare PPO | Attending: Emergency Medicine | Admitting: Emergency Medicine

## 2020-05-05 ENCOUNTER — Other Ambulatory Visit: Payer: Self-pay

## 2020-05-05 ENCOUNTER — Encounter (HOSPITAL_COMMUNITY): Payer: Self-pay | Admitting: Emergency Medicine

## 2020-05-05 DIAGNOSIS — I2510 Atherosclerotic heart disease of native coronary artery without angina pectoris: Secondary | ICD-10-CM | POA: Diagnosis not present

## 2020-05-05 DIAGNOSIS — E119 Type 2 diabetes mellitus without complications: Secondary | ICD-10-CM | POA: Diagnosis not present

## 2020-05-05 DIAGNOSIS — I10 Essential (primary) hypertension: Secondary | ICD-10-CM | POA: Insufficient documentation

## 2020-05-05 DIAGNOSIS — R079 Chest pain, unspecified: Secondary | ICD-10-CM | POA: Diagnosis not present

## 2020-05-05 DIAGNOSIS — R21 Rash and other nonspecific skin eruption: Secondary | ICD-10-CM | POA: Diagnosis not present

## 2020-05-05 LAB — CBC
HCT: 37.6 % (ref 36.0–46.0)
Hemoglobin: 12.5 g/dL (ref 12.0–15.0)
MCH: 31.3 pg (ref 26.0–34.0)
MCHC: 33.2 g/dL (ref 30.0–36.0)
MCV: 94 fL (ref 80.0–100.0)
Platelets: 239 10*3/uL (ref 150–400)
RBC: 4 MIL/uL (ref 3.87–5.11)
RDW: 12 % (ref 11.5–15.5)
WBC: 6.8 10*3/uL (ref 4.0–10.5)
nRBC: 0 % (ref 0.0–0.2)

## 2020-05-05 LAB — BASIC METABOLIC PANEL
Anion gap: 8 (ref 5–15)
BUN: 22 mg/dL (ref 8–23)
CO2: 29 mmol/L (ref 22–32)
Calcium: 8.8 mg/dL — ABNORMAL LOW (ref 8.9–10.3)
Chloride: 101 mmol/L (ref 98–111)
Creatinine, Ser: 1.21 mg/dL — ABNORMAL HIGH (ref 0.44–1.00)
GFR, Estimated: 50 mL/min — ABNORMAL LOW (ref >60)
Glucose, Bld: 121 mg/dL — ABNORMAL HIGH (ref 70–99)
Potassium: 4.1 mmol/L (ref 3.5–5.1)
Sodium: 138 mmol/L (ref 135–145)

## 2020-05-05 LAB — TROPONIN I (HIGH SENSITIVITY): Troponin I (High Sensitivity): 6 ng/L (ref <18)

## 2020-05-05 IMAGING — CR DG CHEST 2V
2 series · 2 of 2 positions shown · non-contrast
Comparison: None.

CLINICAL DATA: 64-year-old female with chest pain.

EXAM:
CHEST - 2 VIEW

[w chest pa]
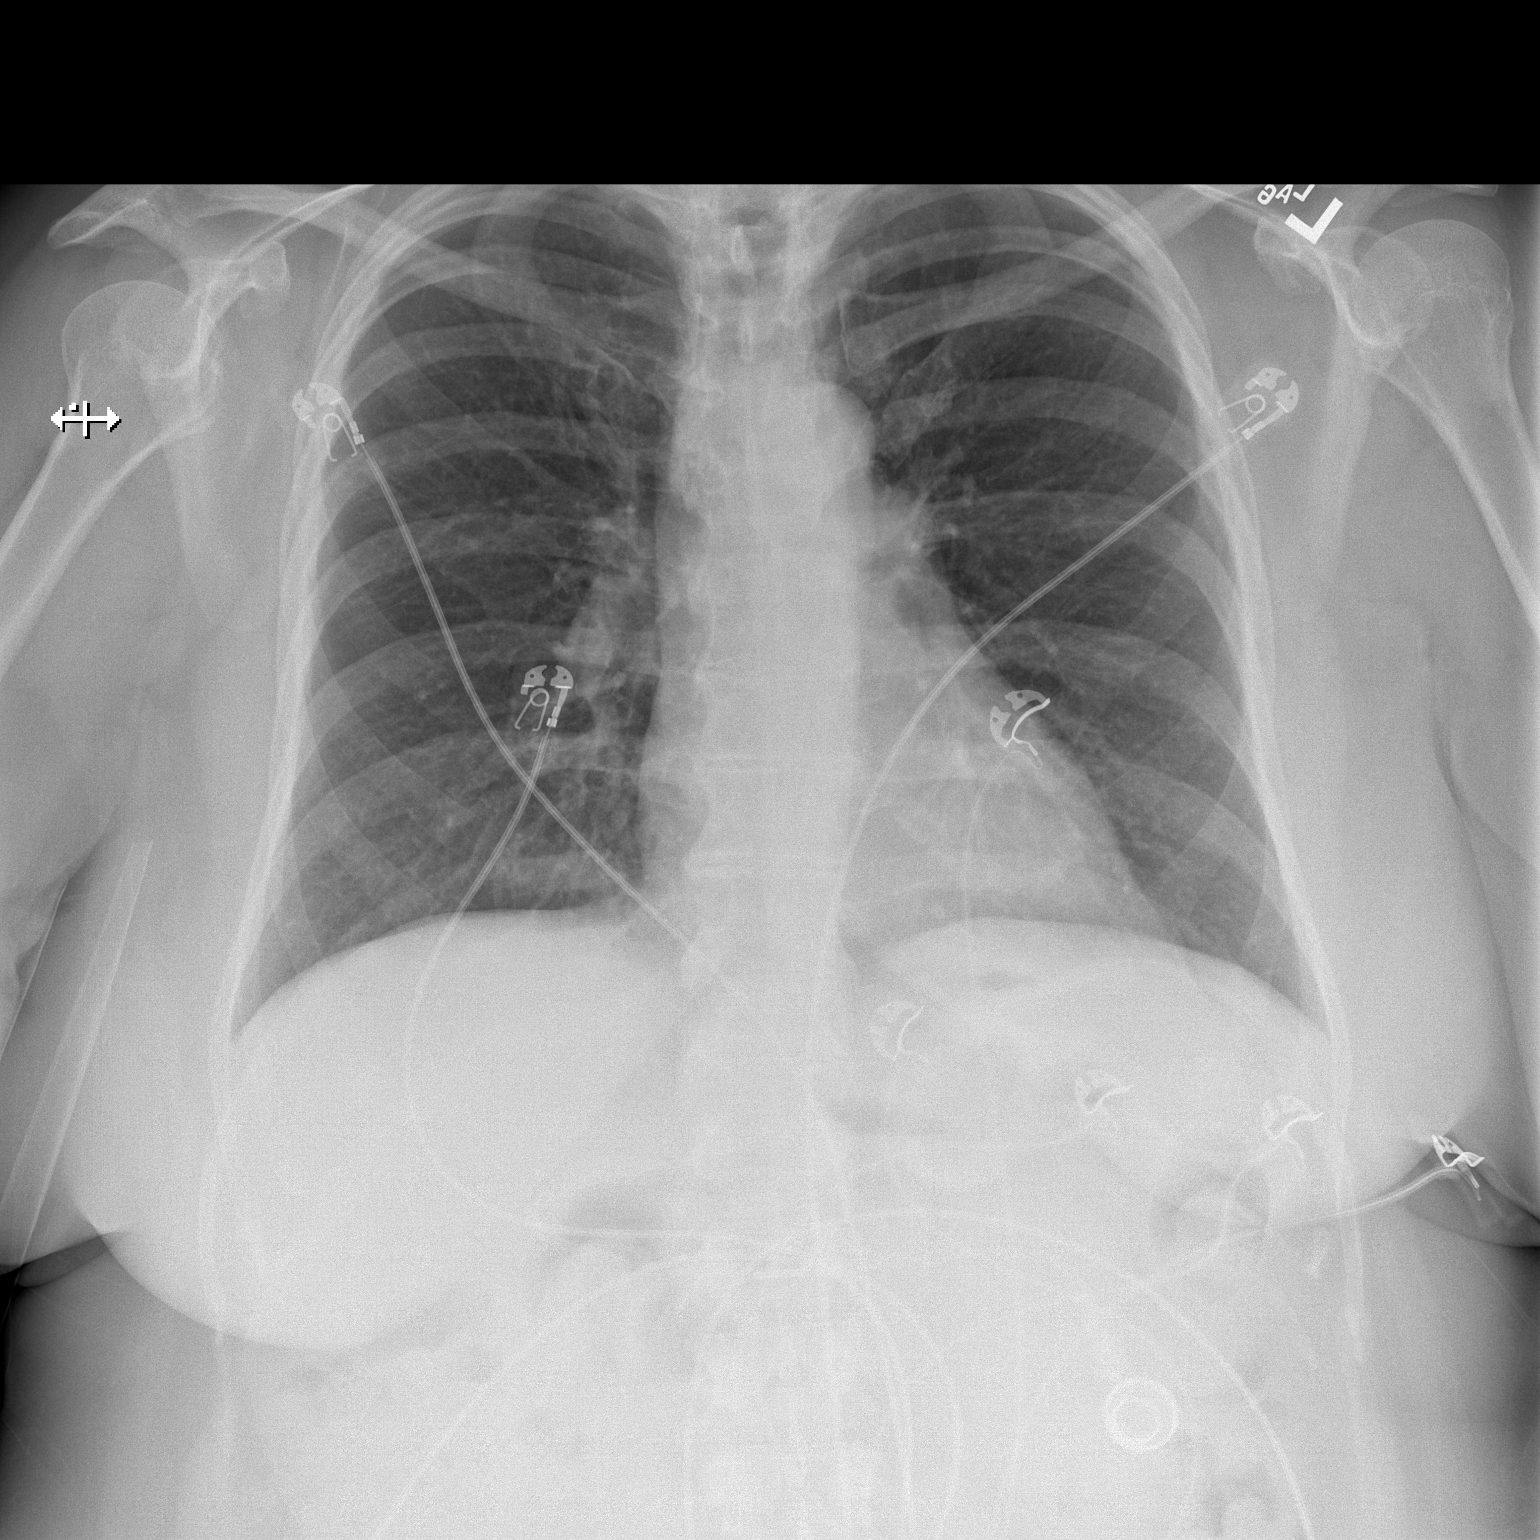

[w chest lat]
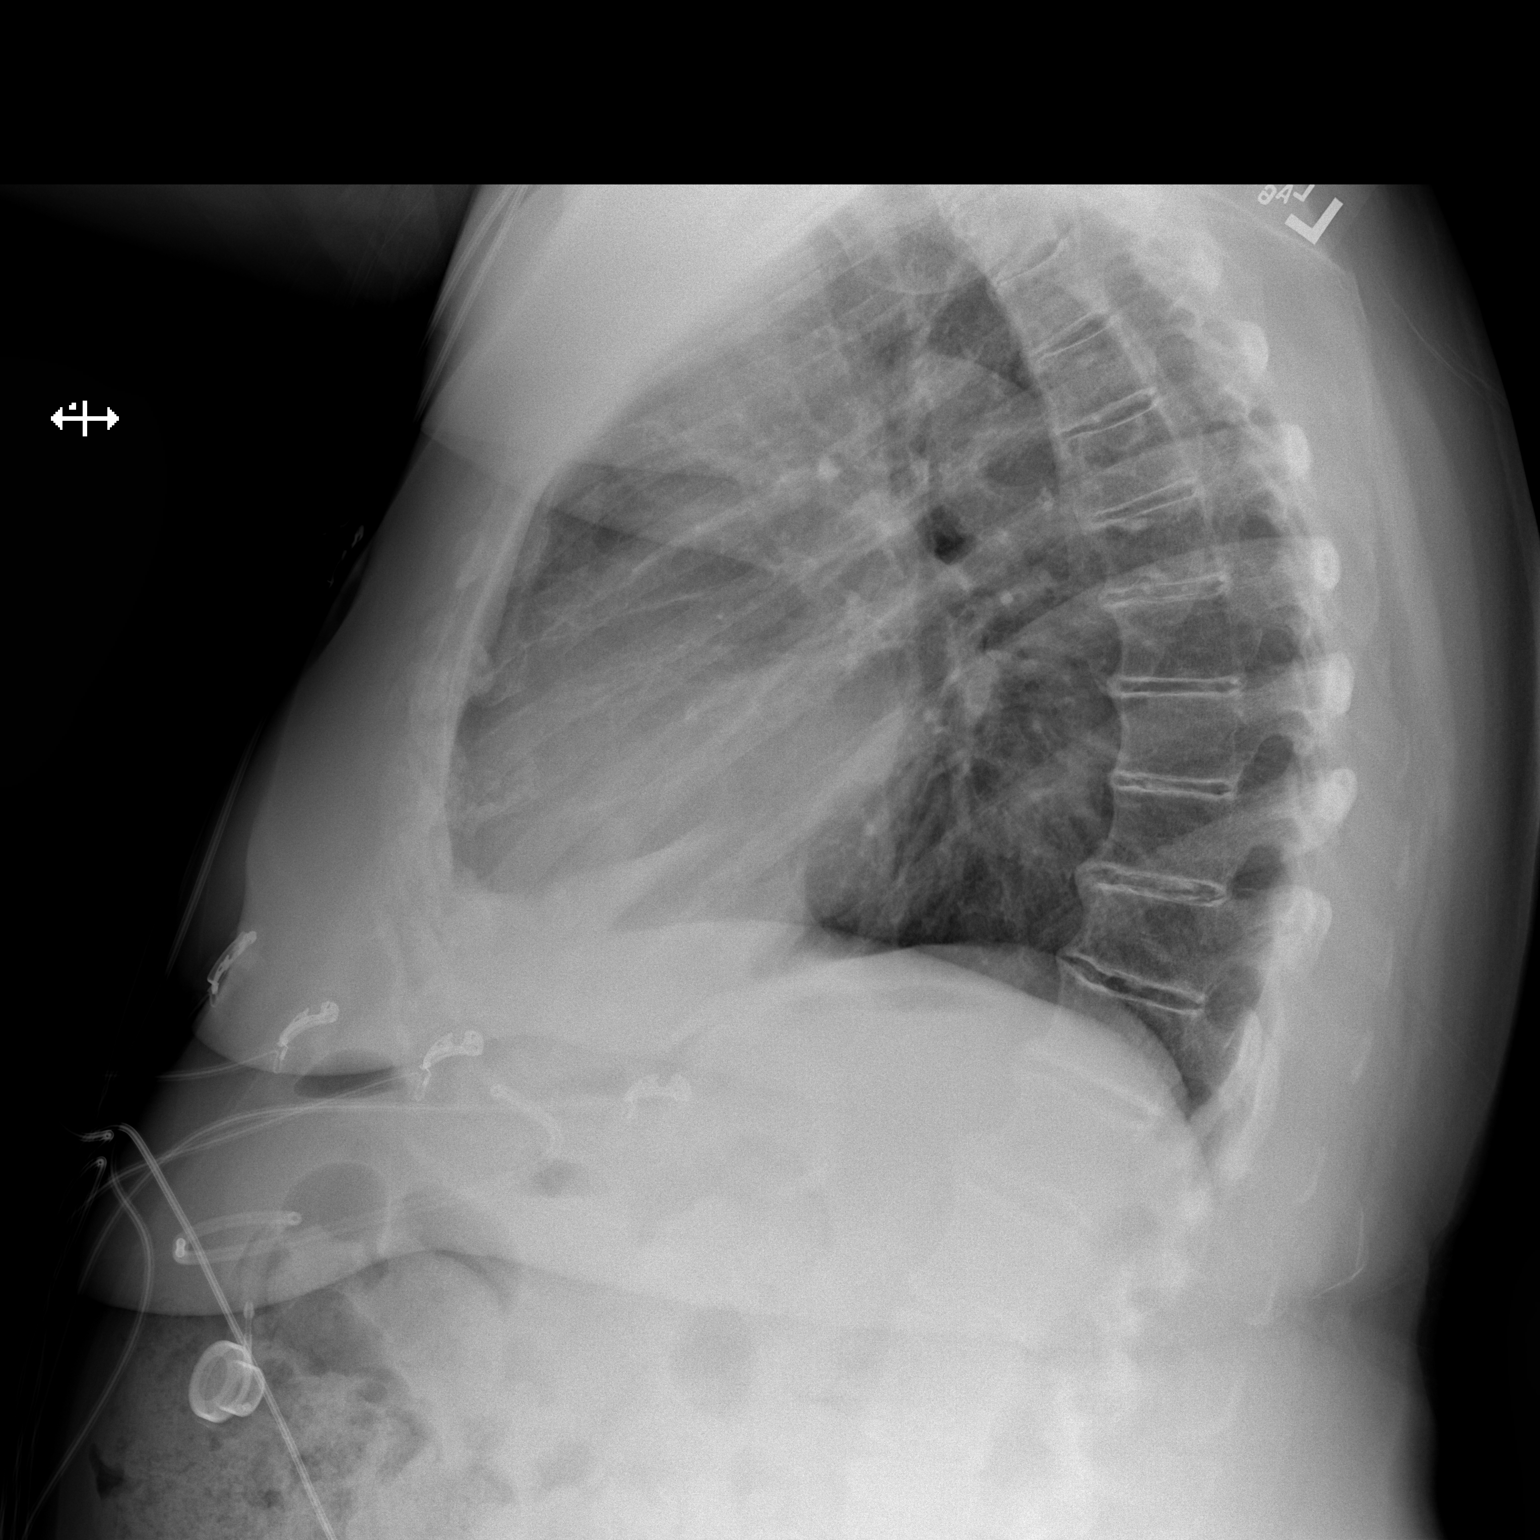

[2 of 2 positions shown; findings below may reference images not displayed]

FINDINGS: No focal consolidation, pleural effusion or pneumothorax. The
cardiac silhouette is within limits. No acute osseous pathology.
Degenerative changes of the spine.
IMPRESSION: No active cardiopulmonary disease.

## 2020-05-05 MED ORDER — OXYCODONE HCL 5 MG PO TABS
5.0000 mg | ORAL_TABLET | Freq: Once | ORAL | Status: AC
  Administered 2020-05-05: 5 mg via ORAL
  Filled 2020-05-05: qty 1

## 2020-05-05 MED ORDER — ASPIRIN 81 MG PO CHEW
324.0000 mg | CHEWABLE_TABLET | Freq: Once | ORAL | Status: AC
  Administered 2020-05-05: 324 mg via ORAL
  Filled 2020-05-05: qty 4

## 2020-05-05 NOTE — ED Notes (Signed)
Pt to xray

## 2020-05-05 NOTE — ED Triage Notes (Signed)
Patient here from home reporting left sided chest pain that started 2 hours ago, constant sharp. Denies n/v.

## 2020-05-05 NOTE — ED Provider Notes (Signed)
[facility]-EMERGENCY DEPT [facility] Emergency Department Provider Note MRN:  [id number]  Arrival date & time: 05/06/20     Chief Complaint   Chest Pain   History of Present Illness   Rachel Ayers is a 64 y.o. year-old female with a history of hypertension, diabetes, Takotsubo's cardiomyopathy presenting to the ED with chief complaint of chest pain.  Location: Central and right-sided chest Duration: 3 hours Onset: Sudden Timing: Constant Description: Squeezing Severity: Moderate Exacerbating/Alleviating Factors: no change with deep breaths, no change with exertion, no change with position.  Is worse with palpation of the chest Associated Symptoms: None, denies dizziness, no diaphoresis, no nausea, no vomiting, no shortness of breath    Review of Systems  A complete 10 system review of systems was obtained and all systems are negative except as noted in the HPI and PMH.   Patient's Health History    Past Medical History:  Diagnosis Date  . CAD (coronary artery disease)   . Depression   . DM (diabetes mellitus) (HCC)   . Estrogen deficiency   . HTN (hypertension)   . Hypercholesteremia   . MI (myocardial infarction) (HCC)   . Morbid obesity (HCC)   . OSA (obstructive sleep apnea)     History reviewed. No pertinent surgical history.  Family History  Problem Relation Age of Onset  . Diabetes Father     Social History   Socioeconomic History  . Marital status: Divorced    Spouse name: Not on file  . Number of children: Not on file  . Years of education: Not on file  . Highest education level: Not on file  Occupational History  . Not on file  Tobacco Use  . Smoking status: Never Smoker  . Smokeless tobacco: Never Used  Substance and Sexual Activity  . Alcohol use: Never  . Drug use: Never  . Sexual activity: Never  Other Topics Concern  . Not on file  Social History Narrative  . Not on file   Social Determinants of Health   Financial Resource  Strain:   . Difficulty of Paying Living Expenses: Not on file  Food Insecurity:   . Worried About Programme researcher, broadcasting/film/video in the Last Year: Not on file  . Ran Out of Food in the Last Year: Not on file  Transportation Needs:   . Lack of Transportation (Medical): Not on file  . Lack of Transportation (Non-Medical): Not on file  Physical Activity:   . Days of Exercise per Week: Not on file  . Minutes of Exercise per Session: Not on file  Stress:   . Feeling of Stress : Not on file  Social Connections:   . Frequency of Communication with Friends and Family: Not on file  . Frequency of Social Gatherings with Friends and Family: Not on file  . Attends Religious Services: Not on file  . Active Member of Clubs or Organizations: Not on file  . Attends Banker Meetings: Not on file  . Marital Status: Not on file  Intimate Partner Violence:   . Fear of Current or Ex-Partner: Not on file  . Emotionally Abused: Not on file  . Physically Abused: Not on file  . Sexually Abused: Not on file     Physical Exam   Vitals:   05/05/20 2215 05/05/20 2359  BP: 125/67 124/61  Pulse: 62 (!) 56  Resp: 13 16  Temp:  97.7 F (36.5 C)  SpO2: 97% 97%    CONSTITUTIONAL: Well-appearing,  NAD NEURO:  Alert and oriented x 3, no focal deficits EYES:  eyes equal and reactive ENT/NECK:  no LAD, no JVD CARDIO: Regular rate, well-perfused, normal S1 and S2 PULM:  CTAB no wheezing or rhonchi GI/GU:  normal bowel sounds, non-distended, non-tender MSK/SPINE:  No gross deformities, no edema SKIN:  no rash, atraumatic PSYCH:  Appropriate speech and behavior  *Additional and/or pertinent findings included in MDM below  Diagnostic and Interventional Summary    EKG Interpretation  Date/Time:  Tuesday May 05 2020 21:11:37 EST Ventricular Rate:  73 PR Interval:    QRS Duration: 92 QT Interval:  417 QTC Calculation: 460 R Axis:   -45 Text Interpretation: Sinus rhythm Left anterior fascicular  block Consider left ventricular hypertrophy Anterior Q waves, possibly due to LVH 12 Lead; Mason-Likar Confirmed by Kennis Carina [phone]) on 05/05/2020 9:38:30 PM      Labs Reviewed  BASIC METABOLIC PANEL - Abnormal; Notable for the following components:      Result Value   Glucose, Bld 121 (*)    Creatinine, Ser 1.21 (*)    Calcium 8.8 (*)    GFR, Estimated 50 (*)    All other components within normal limits  CBC  TROPONIN I (HIGH SENSITIVITY)  TROPONIN I (HIGH SENSITIVITY)    DG Chest 2 View  Final Result      Medications  oxyCODONE (Oxy IR/ROXICODONE) immediate release tablet 5 mg (5 mg Oral Given 05/05/20 2201)  aspirin chewable tablet 324 mg (324 mg Oral Given 05/05/20 2201)     Procedures  /  Critical Care Procedures  ED Course and Medical Decision Making  I have reviewed the triage vital signs, the nursing notes, and pertinent available records from the EMR.  Listed above are laboratory and imaging tests that I personally ordered, reviewed, and interpreted and then considered in my medical decision making (see below for details).  Awaiting laboratory evaluation in this 64 year old female with history of Takotsubo's, diabetes, considering ACS.  No evidence of DVT, no hypoxia, no tachycardia, doubt PE.     Work-up is reassuring, troponin negative x2.  Given the tenderness to palpation I am favoring costochondritis.  Patient explains that she has had costochondritis before and this feels similar.  Appropriate for discharge, advised close cardiology follow-up.  Elmer Sow. Pilar Plate, MD [facility] Emergency Medicine [facility] [email or web address]  Final Clinical Impressions(s) / ED Diagnoses     ICD-10-CM   1. Chest pain, unspecified type  R07.9     ED Discharge Orders    None       Discharge Instructions Discussed with and Provided to Patient:     Discharge Instructions     You were evaluated in the Emergency Department and after careful  evaluation, we did not find any emergent condition requiring admission or further testing in the hospital.  Your exam/testing today was overall reassuring.  Your symptoms may be due to costochondritis.  We recommend Tylenol and Motrin at home for discomfort and follow-up closely with your cardiologist  Please return to the Emergency Department if you experience any worsening of your condition.  Thank you for allowing [place] to be a part of your care.        Sabas Sous, MD 05/06/20 [id number]

## 2020-05-06 LAB — TROPONIN I (HIGH SENSITIVITY): Troponin I (High Sensitivity): 6 ng/L (ref <18)

## 2020-05-06 NOTE — Discharge Instructions (Addendum)
You were evaluated in the Emergency Department and after careful evaluation, we did not find any emergent condition requiring admission or further testing in the hospital.  Your exam/testing today was overall reassuring.  Your symptoms may be due to costochondritis.  We recommend Tylenol and Motrin at home for discomfort and follow-up closely with your cardiologist  Please return to the Emergency Department if you experience any worsening of your condition.  Thank you for allowing [place] to be a part of your care.

## 2020-05-12 ENCOUNTER — Ambulatory Visit: Payer: Medicare PPO | Admitting: Orthopaedic Surgery

## 2020-05-25 DIAGNOSIS — Z79899 Other long term (current) drug therapy: Secondary | ICD-10-CM | POA: Diagnosis not present

## 2020-05-25 DIAGNOSIS — L659 Nonscarring hair loss, unspecified: Secondary | ICD-10-CM | POA: Diagnosis not present

## 2020-05-26 ENCOUNTER — Ambulatory Visit
Admission: RE | Admit: 2020-05-26 | Discharge: 2020-05-26 | Disposition: A | Discharge status: Home / Self Care | Payer: Medicare PPO | Source: Ambulatory Visit | Attending: Family Medicine | Admitting: Family Medicine

## 2020-05-26 ENCOUNTER — Other Ambulatory Visit: Payer: Self-pay

## 2020-05-26 ENCOUNTER — Other Ambulatory Visit: Payer: Medicare PPO

## 2020-05-26 ENCOUNTER — Ambulatory Visit: Payer: Medicare PPO

## 2020-05-26 DIAGNOSIS — E2839 Other primary ovarian failure: Secondary | ICD-10-CM

## 2020-05-26 DIAGNOSIS — Z780 Asymptomatic menopausal state: Secondary | ICD-10-CM | POA: Diagnosis not present

## 2020-05-26 DIAGNOSIS — Z1231 Encounter for screening mammogram for malignant neoplasm of breast: Secondary | ICD-10-CM

## 2020-05-26 DIAGNOSIS — Z13820 Encounter for screening for osteoporosis: Secondary | ICD-10-CM | POA: Diagnosis not present

## 2020-05-26 IMAGING — MG DIGITAL SCREENING BILAT W/ TOMO W/ CAD
8 series · 8 of 24 positions shown · non-contrast
Comparison: Previous exam(s).

CLINICAL DATA: Screening.

EXAM:
DIGITAL SCREENING BILATERAL MAMMOGRAM WITH TOMO AND CAD

[L MLO synth-2D]
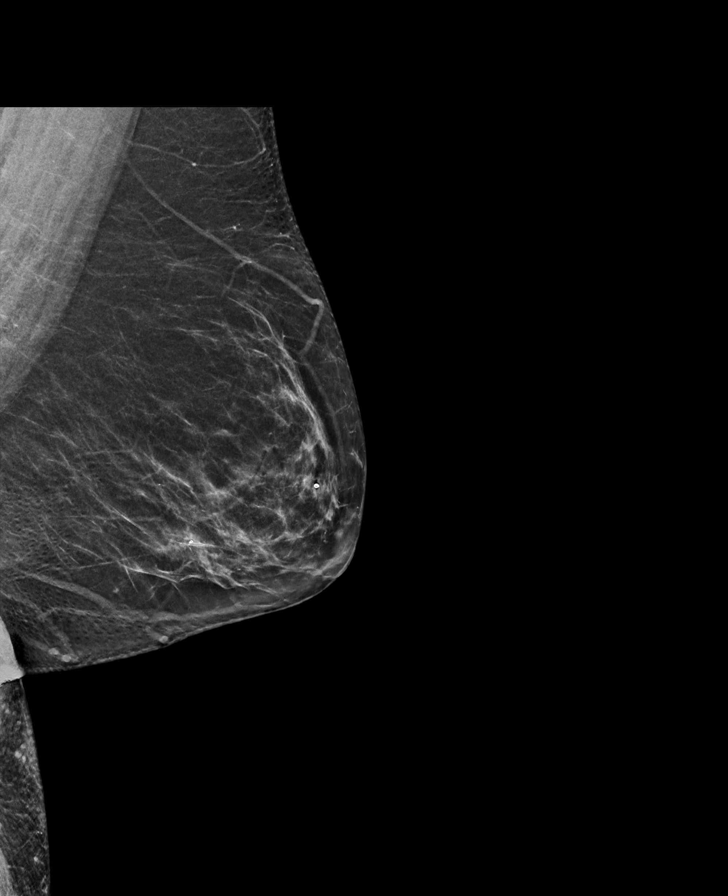

[R MLO synth-2D]
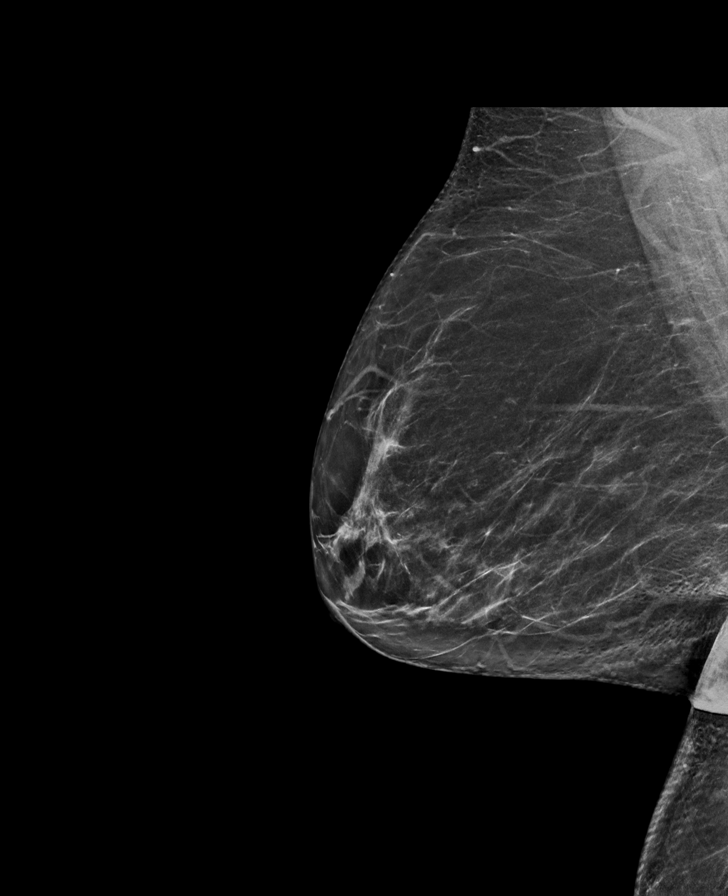

[R CC synth-2D]
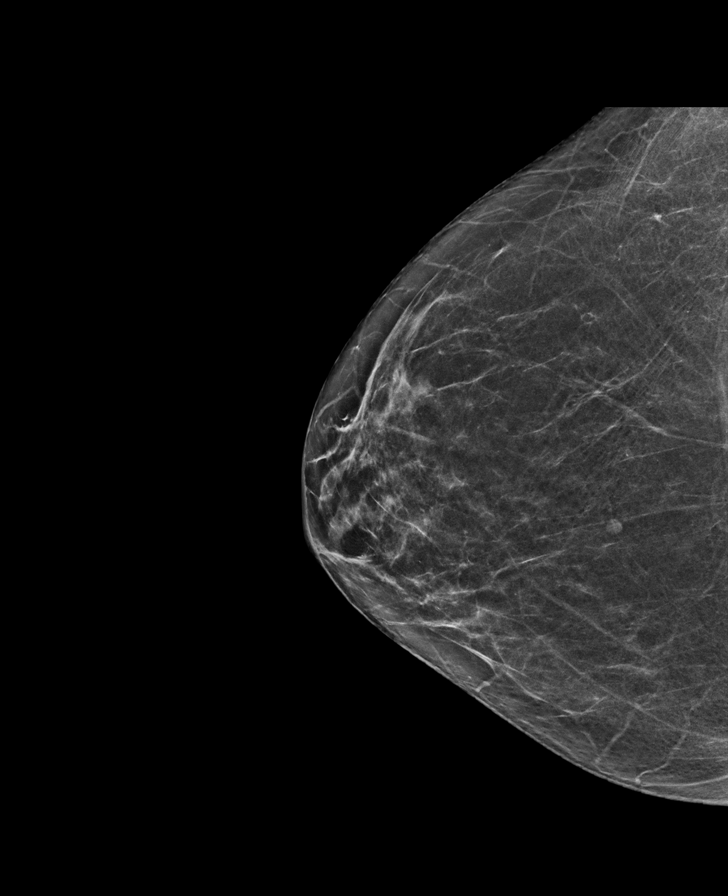

[L CC synth-2D]
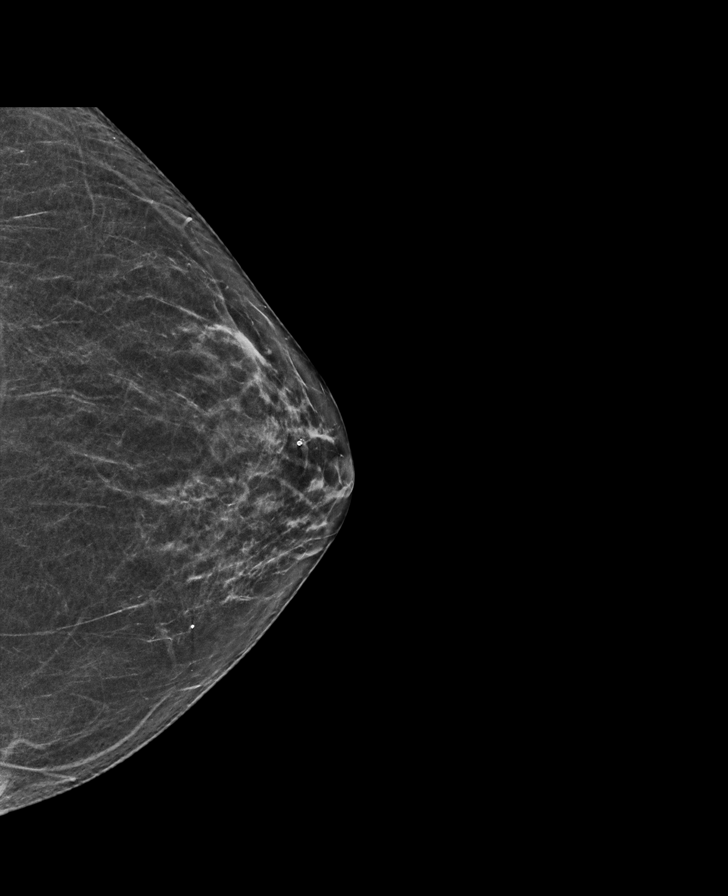

[R CC tomo · tomo slice 31/62.0]
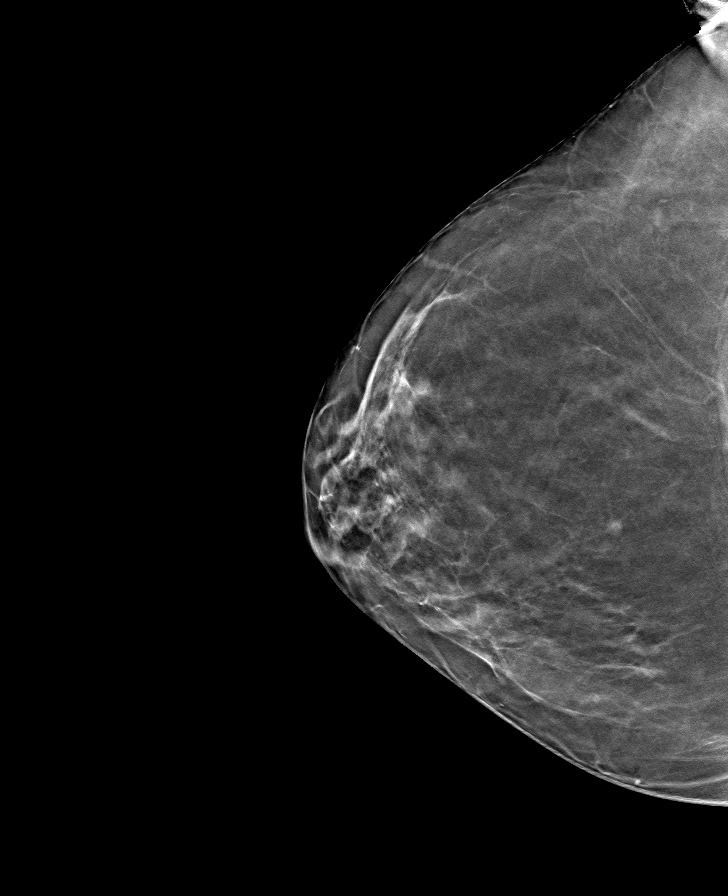

[L MLO tomo · tomo slice 33/66.0]
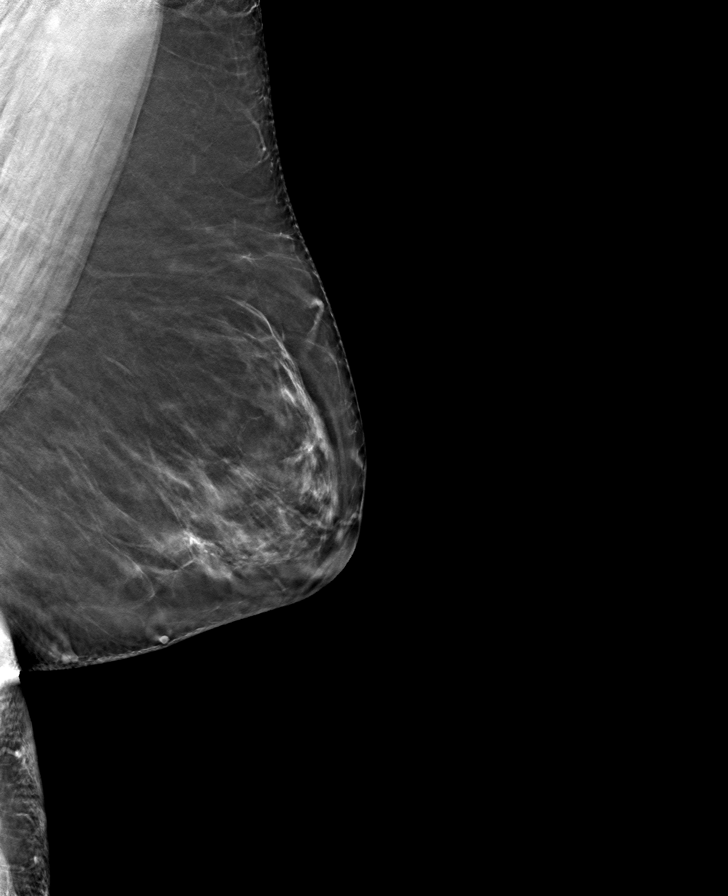

[L CC tomo · tomo slice 31/60.0]
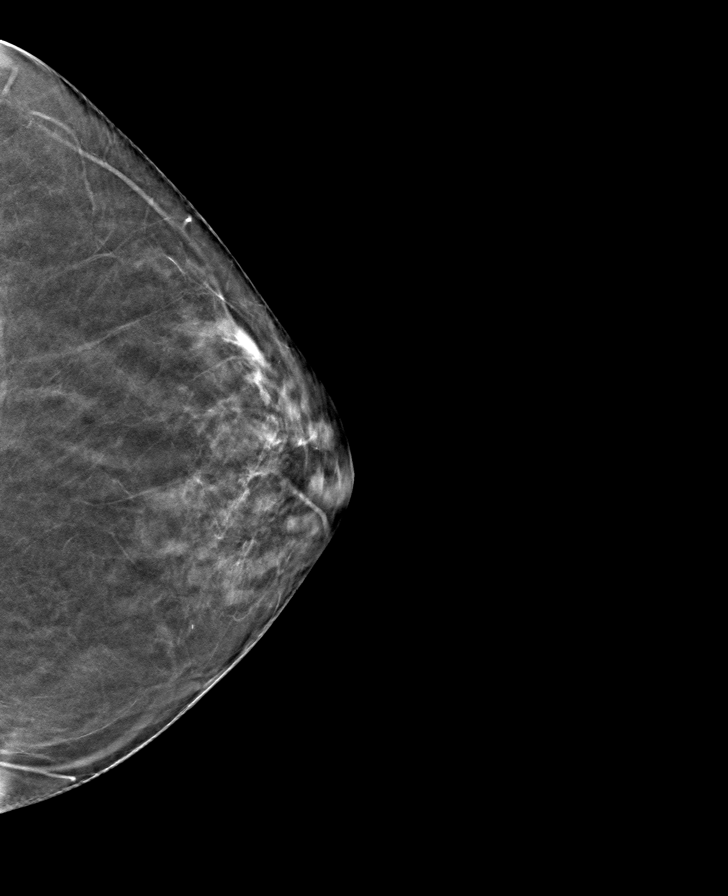

[R MLO tomo · tomo slice 36/71.0]
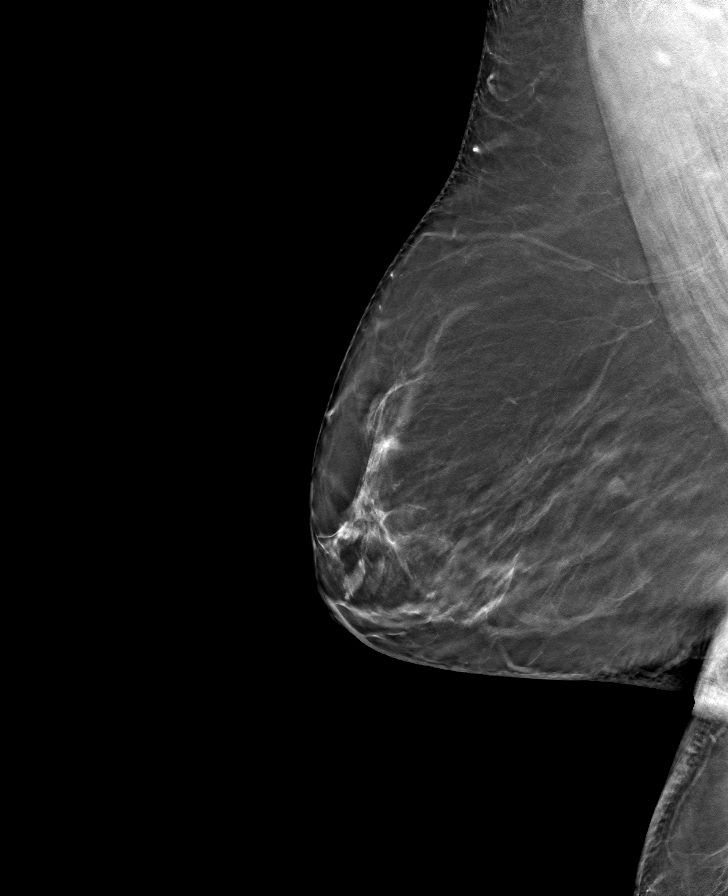

[8 of 24 positions shown; findings below may reference images not displayed]

ACR Breast Density Category b: There are scattered areas of
fibroglandular density.
FINDINGS: There are no findings suspicious for malignancy. Images were
processed with CAD.
IMPRESSION: No mammographic evidence of malignancy. A result letter of this
screening mammogram will be mailed directly to the patient.

RECOMMENDATION:
Screening mammogram in one year. (Code:[TQ])

BI-RADS CATEGORY  1: Negative.

## 2020-06-16 ENCOUNTER — Ambulatory Visit: Payer: Medicare PPO | Admitting: Internal Medicine

## 2020-06-19 ENCOUNTER — Encounter: Payer: Self-pay | Admitting: Internal Medicine

## 2020-06-19 ENCOUNTER — Ambulatory Visit: Payer: Medicare PPO | Admitting: Internal Medicine

## 2020-06-19 ENCOUNTER — Other Ambulatory Visit: Payer: Self-pay

## 2020-06-19 VITALS — BP 120/68 | HR 69 | Ht 64.0 in | Wt 196.0 lb

## 2020-06-19 DIAGNOSIS — I5181 Takotsubo syndrome: Secondary | ICD-10-CM

## 2020-06-19 DIAGNOSIS — E1169 Type 2 diabetes mellitus with other specified complication: Secondary | ICD-10-CM | POA: Diagnosis not present

## 2020-06-19 DIAGNOSIS — I358 Other nonrheumatic aortic valve disorders: Secondary | ICD-10-CM | POA: Insufficient documentation

## 2020-06-19 DIAGNOSIS — E669 Obesity, unspecified: Secondary | ICD-10-CM | POA: Diagnosis not present

## 2020-06-19 DIAGNOSIS — E1159 Type 2 diabetes mellitus with other circulatory complications: Secondary | ICD-10-CM

## 2020-06-19 DIAGNOSIS — I491 Atrial premature depolarization: Secondary | ICD-10-CM

## 2020-06-19 DIAGNOSIS — E782 Mixed hyperlipidemia: Secondary | ICD-10-CM | POA: Diagnosis not present

## 2020-06-19 DIAGNOSIS — I493 Ventricular premature depolarization: Secondary | ICD-10-CM

## 2020-06-19 DIAGNOSIS — I253 Aneurysm of heart: Secondary | ICD-10-CM | POA: Diagnosis not present

## 2020-06-19 DIAGNOSIS — I152 Hypertension secondary to endocrine disorders: Secondary | ICD-10-CM

## 2020-06-19 NOTE — Progress Notes (Signed)
Cardiology Office Note:    Date:  06/19/2020   ID:  Rachel Ayers, DOB [birth year], MRN [id number]  PCP:  Darrow Bussing, MD  [facility] Cardiologist:  Christell Constant, MD  [facility] Electrophysiologist:  None   Referring MD: Darrow Bussing, MD   IO:MBTDHR up  History of Present Illness:    Rachel Ayers is a 65 y.o. 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  Had echocardiogram with question of RV dysfunction with CMR showing no RV dysfunction.  Had some evidence of atrial septal aneurysm without evidence of PFO.   Patient notes that she is doing .  Since last visit notes no changes.  Relevant interval testing or therapy include Echo, CMR.  There are no interval hospital/ED visit.    No chest pain or pressure .  No SOB/DOE and no PND/Orthopnea.  No weight gain or leg swelling.  No palpitations or syncope .  Ambulatory blood pressure not done.   Past Medical History:  Diagnosis Date  . CAD (coronary artery disease)   . Depression   . DM (diabetes mellitus) (HCC)   . Estrogen deficiency   . HTN (hypertension)   . Hypercholesteremia   . MI (myocardial infarction) (HCC)   . Morbid obesity (HCC)   . OSA (obstructive sleep apnea)    No past surgical history on file.  Current Medications: Current Meds  Medication Sig  . Accu-Chek Softclix Lancets lancets 1 each by Other route daily. E11.9  . albuterol (VENTOLIN HFA) 108 (90 Base) MCG/ACT inhaler Inhale 2 puffs into the lungs every 6 (six) hours as needed for wheezing or shortness of breath.  . Blood Glucose Monitoring Suppl (ACCU-CHEK GUIDE ME) w/Device KIT 1 each by Does not apply route daily. E11.9  . budesonide-formoterol (SYMBICORT)  160-4.5 MCG/ACT inhaler Inhale 2 puffs into the lungs 2 (two) times daily.  Marland Kitchen buPROPion (WELLBUTRIN SR) 200 MG 12 hr tablet Take 200 mg by mouth daily.  Marland Kitchen escitalopram (LEXAPRO) 20 MG tablet Take 20 mg by mouth daily.  Marland Kitchen glucose blood (ACCU-CHEK GUIDE) test strip 1 each by Other route daily. E11.9  . hydrALAZINE (APRESOLINE) 25 MG tablet Take 25 mg by mouth 2 (two) times daily.  . Insulin Pen Needle (PEN NEEDLES) 32G X 4 MM MISC by Does not apply route.  . lansoprazole (PREVACID) 15 MG capsule Take 15 mg by mouth daily at 12 noon.  . liraglutide (VICTOZA) 18 MG/3ML SOPN Inject 0.6 mg into the skin daily.   Marland Kitchen METOPROLOL SUCCINATE ER PO Take 25 mg by mouth daily.   . mometasone (NASONEX) 50 MCG/ACT nasal spray daily.  . MULTIPLE VITAMIN PO Take 1 tablet by mouth daily.  . simvastatin (ZOCOR) 40 MG tablet Take 40 mg by mouth daily.  . [DISCONTINUED] cyclobenzaprine (FLEXERIL) 5 MG tablet Take 1-2 tablets (5-10 mg total) by mouth 3 (three) times daily as needed for muscle spasms.    Allergies:   Sulfa antibiotics   Social History   Socioeconomic History  . Marital status: Divorced    Spouse name: Not on file  . Number of children: Not on file  . Years of education: Not on file  . Highest education level: Not on file  Occupational History  . Not on  file  Tobacco Use  . Smoking status: Never Smoker  . Smokeless tobacco: Never Used  Substance and Sexual Activity  . Alcohol use: Never  . Drug use: Never  . Sexual activity: Never  Other Topics Concern  . Not on file  Social History Narrative  . Not on file   Social Determinants of Health   Financial Resource Strain: Not on file  Food Insecurity: Not on file  Transportation Needs: Not on file  Physical Activity: Not on file  Stress: Not on file  Social Connections: Not on file    Family History: The patient's family history includes Diabetes in her father. Sister had Mi at age 48 (also had tobacco use) Father had heart disease  NOS.  ROS:   Please see the history of present illness.    All other systems reviewed and are negative.  EKGs/Labs/Other Studies Reviewed:    The following studies were reviewed today:  EKG:   02/26/20: Sinus rhythm rate of 60 normal axis, q wave in V1, borderline criteria for anterior infarct  Cardiac Event Monitoring- OSH Dr. Carlena Bjornstad: Results: PVC and PACs on last Holter Monitor  Transthoracic Echocardiogram: Date: 03/09/20 Results: 1. Left ventricular ejection fraction, by estimation, is 65 to 70%. The  left ventricle has normal function. The left ventricle has no regional  wall motion abnormalities. There is mild left ventricular hypertrophy.  Left ventricular diastolic parameters  are indeterminate.  2. Endocardiurm of RV very difficult to see. Definity did not help. RV  function appears reduced . Right ventricular systolic function was not  well visualized. The right ventricular size is not well visualized.  3. The mitral valve is normal in structure. Trivial mitral valve  regurgitation.  4. The aortic valve is abnormal. Aortic valve regurgitation is not  visualized. Mild aortic valve sclerosis is present, with no evidence of  aortic valve stenosis.  5. The inferior vena cava is normal in size with greater than 50%  respiratory variability, suggesting right atrial pressure of 3 mmHg.   Cardiac MR: Date: 03/31/20 Results: FINDINGS: 1. Normal left ventricular size, LVEDVI 38 ml/m2. Mild, concentric LV thickness with normal LV Mass (Myo mass/BSA 59 g/m2). Hyperdynamic LV systolic function (LVEF =06%). There are no regional wall motion abnormalities. There is no late gadolinium enhancement in the left ventricular myocardium. 2. Normal right ventricular size and thickness, RVEDVI 46 ml/m2. Normal RV systolic function (RVEF =23%). There are no regional wall motion abnormalities or aneurysms. No criteria for ARVC met. 3. Normal left and right atrial size. Evidence of  atrial septal aneurysm. 4. Normal size of the aortic root, ascending aorta and pulmonary artery. 5.  No significant valvular abnormalities. 6.  Normal pericardium.  No pericardial effusion.  IMPRESSION: Normal biventricular function without evidence of late gadolinium enhancement. No criteria for ARVC met.  Left/Right Heart Catheterizations: Date: 03/22/09 Results: OSH LCP:03/22/09- EF 40% LVEDP 18, Nondominant Circ; Large RCA (dominant vessel) widely patent coronary arteries  Recent Labs: 05/05/2020: BUN 22; Creatinine, Ser 1.21; Hemoglobin 12.5; Platelets 239; Potassium 4.1; Sodium 138  Recent Lipid Panel No results found for: CHOL, TRIG, HDL, CHOLHDL, VLDL, LDLCALC, LDLDIRECT   Physical Exam:    VS:  BP 120/68   Pulse 69   Ht $R'5\' 4"'us$  (1.626 m)   Wt 196 lb (88.9 kg)   SpO2 96%   BMI 33.64 kg/m     Wt Readings from Last 3 Encounters:  06/19/20 196 lb (88.9 kg)  05/05/20 190 lb (86.2 kg)  04/10/20 189 lb (85.7 kg)    GEN: Obese, well developed in no acute distress HEENT: Normal NECK: No JVD; No carotid bruits LYMPHATICS: No lymphadenopathy CARDIAC: RRR, systolic heart murmurm II/VI;  no rubs, gallops RESPIRATORY:  Clear to auscultation without rales, wheezing or rhonchi  ABDOMEN: Soft, non-tender, non-distended MUSCULOSKELETAL:  No edema; No deformity  SKIN: Warm and dry NEUROLOGIC:  Alert and oriented x 3 PSYCHIATRIC:  Normal affect   ASSESSMENT:    No diagnosis found. PLAN:    In order of problems listed above:  Takostubo Cardiomyopathy PVCs and PACs - stable on metoprolol succinate; continue current regimen - recovered   Obesity/DM/HTN - ambulatory blood pressure not done, will start ambulatory BP monitoring; gave education on how to perform ambulatory blood pressure monitoring including the frequency and technique; goal ambulatory blood pressure < 135/85 on average - continue home medications  - discussed diet (DASH/low sodium), and  exercise/weight loss interventions   Hyperlipidemia (Mixed) Aortic Sclerosis - LDL goal less than 70 -continue current statin -Patient defers hld check today will have her PCP send her values - gave education on dietary changes  Atrial Septal Aneurysm - discussed PFO risk; monitor  6 months follow up unless new symptoms or abnormal test results warranting change in plan  Would be reasonable for  Virtual Follow up Would be reasonable for  APP Follow up  Medication Adjustments/Labs and Tests Ordered: Current medicines are reviewed at length with the patient today.  Concerns regarding medicines are outlined above.  No orders of the defined types were placed in this encounter.  No orders of the defined types were placed in this encounter.   There are no Patient Instructions on file for this visit.   Signed, Werner Lean, MD  06/19/2020 10:59 AM    [facility]

## 2020-06-19 NOTE — Patient Instructions (Signed)
Medication Instructions:  Your physician recommends that you continue on your current medications as directed. Please refer to the Current Medication list given to you today.  *If you need a refill on your cardiac medications before your next appointment, please call your pharmacy*   Lab Work: none If you have labs (blood work) drawn today and your tests are completely normal, you will receive your results only by: Marland Kitchen MyChart Message (if you have MyChart) OR . A paper copy in the mail If you have any lab test that is abnormal or we need to change your treatment, we will call you to review the results.   Testing/Procedures: none   Follow-Up: At [facility], you and your health needs are our priority.  As part of our continuing mission to provide you with exceptional heart care, we have created designated Provider Care Teams.  These Care Teams include your primary Cardiologist (physician) and Advanced Practice Providers (APPs -  Physician Assistants and Nurse Practitioners) who all work together to provide you with the care you need, when you need it.  We recommend signing up for the patient portal called "MyChart".  Sign up information is provided on this After Visit Summary.  MyChart is used to connect with patients for Virtual Visits (Telemedicine).  Patients are able to view lab/test results, encounter notes, upcoming appointments, etc.  Non-urgent messages can be sent to your provider as well.   To learn more about what you can do with MyChart, go to [email or web address].    Your next appointment:   6 month(s)  The format for your next appointment:   In Person  Provider:   You may see Christell Constant, MD or one of the following Advanced Practice Providers on your designated Care Team:    Ronie Spies, PA-C  Jacolyn Reedy, PA-C    Other Instructions

## 2020-06-22 DIAGNOSIS — L579 Skin changes due to chronic exposure to nonionizing radiation, unspecified: Secondary | ICD-10-CM | POA: Diagnosis not present

## 2020-06-22 DIAGNOSIS — L218 Other seborrheic dermatitis: Secondary | ICD-10-CM | POA: Diagnosis not present

## 2020-08-07 DIAGNOSIS — I25118 Atherosclerotic heart disease of native coronary artery with other forms of angina pectoris: Secondary | ICD-10-CM | POA: Diagnosis not present

## 2020-08-07 DIAGNOSIS — Z79899 Other long term (current) drug therapy: Secondary | ICD-10-CM | POA: Diagnosis not present

## 2020-08-07 DIAGNOSIS — L659 Nonscarring hair loss, unspecified: Secondary | ICD-10-CM | POA: Diagnosis not present

## 2020-08-07 DIAGNOSIS — E1169 Type 2 diabetes mellitus with other specified complication: Secondary | ICD-10-CM | POA: Diagnosis not present

## 2020-08-07 DIAGNOSIS — E038 Other specified hypothyroidism: Secondary | ICD-10-CM | POA: Diagnosis not present

## 2020-08-07 DIAGNOSIS — I10 Essential (primary) hypertension: Secondary | ICD-10-CM | POA: Diagnosis not present

## 2020-08-07 DIAGNOSIS — E7800 Pure hypercholesterolemia, unspecified: Secondary | ICD-10-CM | POA: Diagnosis not present

## 2020-08-11 DIAGNOSIS — Z1211 Encounter for screening for malignant neoplasm of colon: Secondary | ICD-10-CM | POA: Diagnosis not present

## 2020-08-21 DIAGNOSIS — Z79899 Other long term (current) drug therapy: Secondary | ICD-10-CM | POA: Diagnosis not present

## 2020-09-03 DIAGNOSIS — R195 Other fecal abnormalities: Secondary | ICD-10-CM | POA: Diagnosis not present

## 2020-09-17 DIAGNOSIS — D123 Benign neoplasm of transverse colon: Secondary | ICD-10-CM | POA: Diagnosis not present

## 2020-09-17 DIAGNOSIS — R195 Other fecal abnormalities: Secondary | ICD-10-CM | POA: Diagnosis not present

## 2020-09-17 DIAGNOSIS — K5730 Diverticulosis of large intestine without perforation or abscess without bleeding: Secondary | ICD-10-CM | POA: Diagnosis not present

## 2020-09-17 DIAGNOSIS — K648 Other hemorrhoids: Secondary | ICD-10-CM | POA: Diagnosis not present

## 2020-09-17 DIAGNOSIS — D122 Benign neoplasm of ascending colon: Secondary | ICD-10-CM | POA: Diagnosis not present

## 2020-09-22 DIAGNOSIS — D123 Benign neoplasm of transverse colon: Secondary | ICD-10-CM | POA: Diagnosis not present

## 2020-09-22 DIAGNOSIS — D122 Benign neoplasm of ascending colon: Secondary | ICD-10-CM | POA: Diagnosis not present

## 2020-10-19 DIAGNOSIS — G4733 Obstructive sleep apnea (adult) (pediatric): Secondary | ICD-10-CM | POA: Diagnosis not present

## 2020-10-20 DIAGNOSIS — G4733 Obstructive sleep apnea (adult) (pediatric): Secondary | ICD-10-CM | POA: Diagnosis not present

## 2020-11-19 DIAGNOSIS — G4733 Obstructive sleep apnea (adult) (pediatric): Secondary | ICD-10-CM | POA: Diagnosis not present

## 2020-11-24 DIAGNOSIS — E1165 Type 2 diabetes mellitus with hyperglycemia: Secondary | ICD-10-CM | POA: Diagnosis not present

## 2020-11-24 DIAGNOSIS — Z1339 Encounter for screening examination for other mental health and behavioral disorders: Secondary | ICD-10-CM | POA: Diagnosis not present

## 2020-11-24 DIAGNOSIS — N951 Menopausal and female climacteric states: Secondary | ICD-10-CM | POA: Diagnosis not present

## 2020-11-24 DIAGNOSIS — R635 Abnormal weight gain: Secondary | ICD-10-CM | POA: Diagnosis not present

## 2020-11-24 DIAGNOSIS — E782 Mixed hyperlipidemia: Secondary | ICD-10-CM | POA: Diagnosis not present

## 2020-11-24 DIAGNOSIS — Z1331 Encounter for screening for depression: Secondary | ICD-10-CM | POA: Diagnosis not present

## 2020-11-24 DIAGNOSIS — Z6833 Body mass index (BMI) 33.0-33.9, adult: Secondary | ICD-10-CM | POA: Diagnosis not present

## 2020-11-24 DIAGNOSIS — K219 Gastro-esophageal reflux disease without esophagitis: Secondary | ICD-10-CM | POA: Diagnosis not present

## 2020-11-24 DIAGNOSIS — R5383 Other fatigue: Secondary | ICD-10-CM | POA: Diagnosis not present

## 2020-12-01 DIAGNOSIS — Z6833 Body mass index (BMI) 33.0-33.9, adult: Secondary | ICD-10-CM | POA: Diagnosis not present

## 2020-12-01 DIAGNOSIS — K219 Gastro-esophageal reflux disease without esophagitis: Secondary | ICD-10-CM | POA: Diagnosis not present

## 2020-12-16 DIAGNOSIS — Z9884 Bariatric surgery status: Secondary | ICD-10-CM | POA: Diagnosis not present

## 2020-12-17 ENCOUNTER — Ambulatory Visit (INDEPENDENT_AMBULATORY_CARE_PROVIDER_SITE_OTHER): Payer: Medicare PPO

## 2020-12-17 ENCOUNTER — Encounter: Payer: Self-pay | Admitting: Orthopaedic Surgery

## 2020-12-17 ENCOUNTER — Ambulatory Visit: Payer: Medicare PPO | Admitting: Orthopaedic Surgery

## 2020-12-17 DIAGNOSIS — M542 Cervicalgia: Secondary | ICD-10-CM | POA: Diagnosis not present

## 2020-12-17 DIAGNOSIS — M545 Low back pain, unspecified: Secondary | ICD-10-CM

## 2020-12-17 DIAGNOSIS — G8929 Other chronic pain: Secondary | ICD-10-CM

## 2020-12-17 DIAGNOSIS — M25511 Pain in right shoulder: Secondary | ICD-10-CM

## 2020-12-17 MED ORDER — LIDOCAINE HCL 2 % IJ SOLN
2.0000 mL | INTRAMUSCULAR | Status: AC | PRN
  Administered 2020-12-17: 2 mL

## 2020-12-17 MED ORDER — METHYLPREDNISOLONE ACETATE 40 MG/ML IJ SUSP
40.0000 mg | INTRAMUSCULAR | Status: AC | PRN
  Administered 2020-12-17: 40 mg via INTRA_ARTICULAR

## 2020-12-17 MED ORDER — ACETAMINOPHEN-CODEINE #3 300-30 MG PO TABS: 1.0000 | ORAL_TABLET | Freq: Three times a day (TID) | ORAL | 2 refills | Status: DC | PRN

## 2020-12-17 MED ORDER — BUPIVACAINE HCL 0.25 % IJ SOLN
2.0000 mL | INTRAMUSCULAR | Status: AC | PRN
  Administered 2020-12-17: 2 mL via INTRA_ARTICULAR

## 2020-12-17 MED ORDER — DIAZEPAM 2 MG PO TABS: ORAL_TABLET | ORAL | 0 refills | Status: DC

## 2020-12-17 NOTE — Progress Notes (Signed)
Office Visit Note   Patient: Rachel Ayers           Date of Birth: [birth year]           MRN: [id number] Visit Date: 12/17/2020              Requested by: Darrow Bussing, MD [address] [place],  [place] [address] PCP: Darrow Bussing, MD   Assessment & Plan: Visit Diagnoses:  1. Neck pain   2. Chronic right shoulder pain   3. Low back pain, unspecified back pain laterality, unspecified chronicity, unspecified whether sciatica present     Plan: Impression is chronic right shoulder pain concerning for rotator cuff pathology.  Given her response to previous cortisone injection 5 years ago and the fact that she has fairly good strength, not incredibly concerned for rotator cuff tear, however this pain has been ongoing for over a decade so I feel it is appropriate to order an MRI to assess for this.  She will follow-up with [place] after she has had the MRI.  In the meantime, we have discussed cortisone injection for which she would like to proceed.  In regards to her her neck, we will start her in formal physical therapy.  Call with concerns or questions in the meantime.  Follow-Up Instructions: Return for after MRI.   Orders:  Orders Placed This Encounter  Procedures   Large Joint Inj: R subacromial bursa   XR Cervical Spine 2 or 3 views   XR Lumbar Spine 2-3 Views   XR Shoulder Right   MR SHOULDER RIGHT WO CONTRAST   Ambulatory referral to Physical Therapy   Meds ordered this encounter  Medications   diazepam (VALIUM) 2 MG tablet    Sig: Take one pill one hour prior to mri and then repeat just prior if needed    Dispense:  2 tablet    Refill:  0   acetaminophen-codeine (TYLENOL #3) 300-30 MG tablet    Sig: Take 1 tablet by mouth 3 (three) times daily as needed for moderate pain.    Dispense:  30 tablet    Refill:  2       Procedures: Large Joint Inj: R subacromial bursa on 12/17/2020 10:08 AM Indications: pain Details: 22 G needle Medications: 2 mL  lidocaine 2 %; 2 mL bupivacaine 0.25 %; 40 mg methylPREDNISolone acetate 40 MG/ML Outcome: tolerated well, no immediate complications Patient was prepped and draped in the usual sterile fashion.      Clinical Data: No additional findings.   Subjective: Chief Complaint  Patient presents with   Right Shoulder - Pain    HPI patient is a pleasant 65 year old female who comes in today with chronic right shoulder pain.  She has been dealing with this for over a decade after falling with an outstretched arm.  She was seen by orthopedist little over 5 years ago where cortisone injection was performed.  This significantly helped until a few months ago.  Her pain is started to return.  Pain is to the deltoid.  Worse with forward flexion of the shoulder.  She has been using a cream with mild relief.  She notes that she is unable to take NSAIDs due to poor kidney function.  She denies any weakness to the right arm.  No numbness, tingling or burning.  She has been to therapy in the past without relief of symptoms.  She has not had an MRI that she is aware of of  the right shoulder.  She denies any paresthesias to the upper extremities.  Review of Systems as detailed in HPI.  All others reviewed and are negative.   Objective: Vital Signs: There were no vitals taken for this visit.  Physical Exam well-developed well-nourished female in no acute distress.  Alert and oriented x3.  Ortho Exam right shoulder exam shows a forward flexion to about 140 degrees.  Limited external rotation.  She is able to internally rotate to L5.  Pain with empty can testing.  Near full strength throughout.  She is neurovascular intact distally.  Cervical spine exam shows mild tenderness to the lower levels.  Increased pain with flexion and rotation.  No focal weakness.  She is neurovascular intact distally.  Specialty Comments:  No specialty comments available.  Imaging: XR Cervical Spine 2 or 3 views  Result Date:  12/17/2020 X-rays demonstrate advanced multilevel degenerative changes  XR Lumbar Spine 2-3 Views  Result Date: 12/17/2020 X-rays demonstrate lumbar sacralization L5-S1  XR Shoulder Right  Result Date: 12/17/2020 X-rays demonstrate calcifications likely within the supraspinatus.  No superior migration of the humeral head.    PMFS History: Patient Active Problem List   Diagnosis Date Noted   Obesity, diabetes, and hypertension syndrome (HCC) 06/19/2020   Mixed hyperlipidemia 06/19/2020   Aortic valve sclerosis 06/19/2020   Atrial septal aneurysm 06/19/2020   Strain of calf muscle 04/10/2020   Takotsubo cardiomyopathy 02/26/2020   PVC (premature ventricular contraction) 02/26/2020   PAC (premature atrial contraction) 02/26/2020   Chest pain of uncertain etiology 02/26/2020   Diabetes mellitus with coincident hypertension (HCC) 10/18/2019   Past Medical History:  Diagnosis Date   CAD (coronary artery disease)    Depression    DM (diabetes mellitus) (HCC)    Estrogen deficiency    HTN (hypertension)    Hypercholesteremia    MI (myocardial infarction) (HCC)    Morbid obesity (HCC)    OSA (obstructive sleep apnea)     Family History  Problem Relation Age of Onset   Diabetes Father     History reviewed. No pertinent surgical history. Social History   Occupational History   Not on file  Tobacco Use   Smoking status: Never   Smokeless tobacco: Never  Substance and Sexual Activity   Alcohol use: Never   Drug use: Never   Sexual activity: Never

## 2020-12-19 DIAGNOSIS — G4733 Obstructive sleep apnea (adult) (pediatric): Secondary | ICD-10-CM | POA: Diagnosis not present

## 2020-12-22 ENCOUNTER — Other Ambulatory Visit: Payer: Self-pay | Admitting: Nurse Practitioner

## 2020-12-22 DIAGNOSIS — Z9884 Bariatric surgery status: Secondary | ICD-10-CM

## 2020-12-27 ENCOUNTER — Ambulatory Visit
Admission: RE | Admit: 2020-12-27 | Discharge: 2020-12-27 | Disposition: A | Discharge status: Home / Self Care | Payer: Medicare PPO | Source: Ambulatory Visit | Attending: Orthopaedic Surgery | Admitting: Orthopaedic Surgery

## 2020-12-27 DIAGNOSIS — M25511 Pain in right shoulder: Secondary | ICD-10-CM

## 2020-12-27 DIAGNOSIS — G8929 Other chronic pain: Secondary | ICD-10-CM

## 2020-12-27 IMAGING — MR MR SHOULDER*R* W/O CM
4 of 5 series · 22 of 40 positions shown · non-contrast
Comparison: Plain films right shoulder [DATE].

CLINICAL DATA: Chronic right shoulder pain and limited range of
motion. No recent injury.

EXAM:
MRI OF THE RIGHT SHOULDER WITHOUT CONTRAST
TECHNIQUE: Multiplanar, multisequence MR imaging of the shoulder was performed.
No intravenous contrast was administered.

[Series 6: T2 fat-sat · axial · right · 3.0mm · 0.47mm/px · z∈[-78,+19]mm · 8 of 27 slices shown (1 of 3)]
[im 1/27]
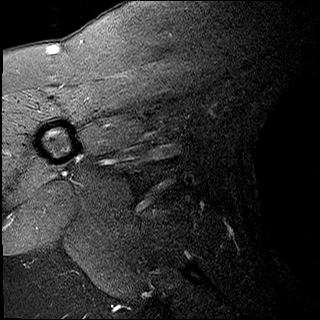
[im 3/27]
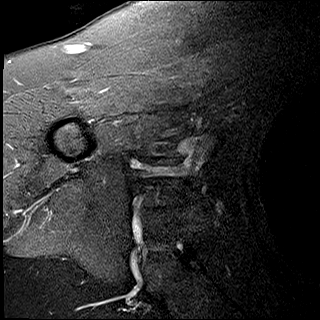
[im 9/27]
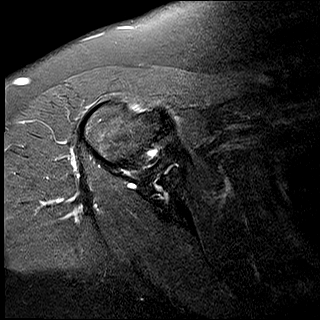
[im 12/27]
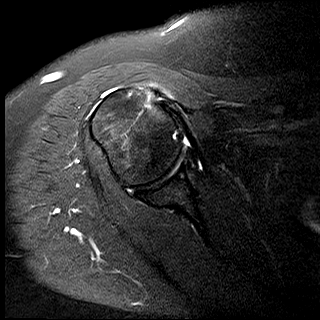
[im 15/27]
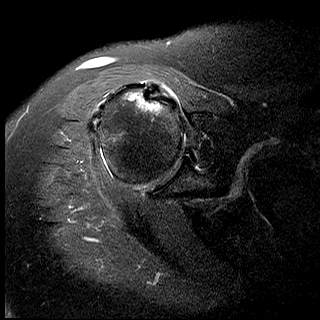
[im 18/27]
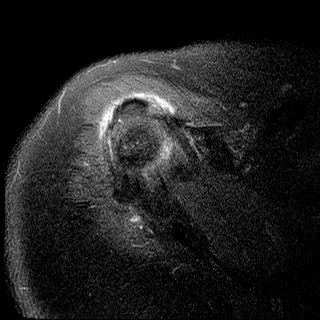
[im 24/27]
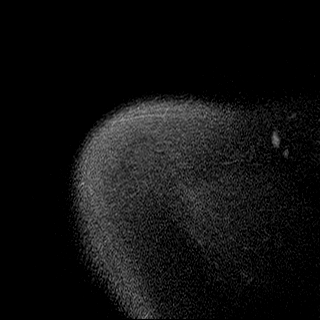
[im 27/27]
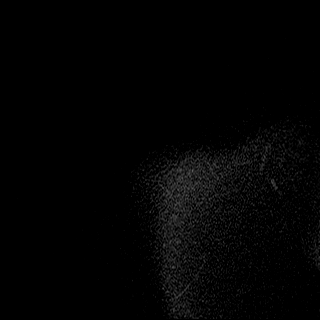

[Series 7: T2 fat-sat · oblique · right · 4.0mm · 0.27mm/px · 4 of 21 slices shown (2 of 3)]
[im 1/21]
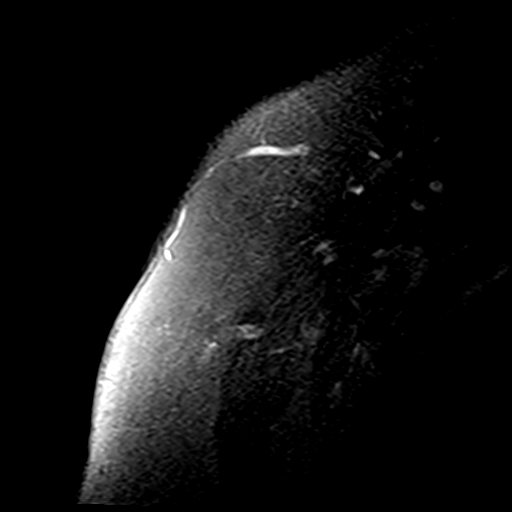
[im 4/21]
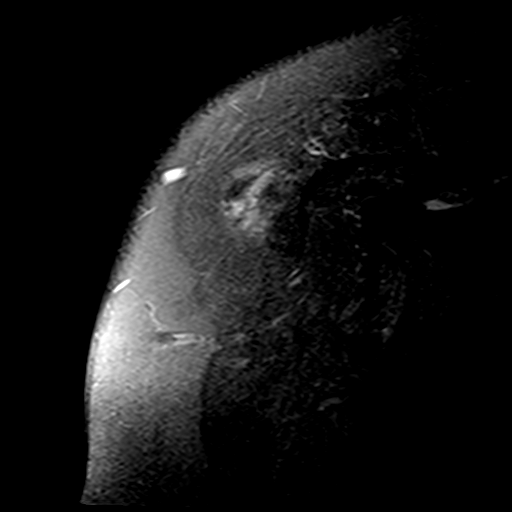
[im 11/21]
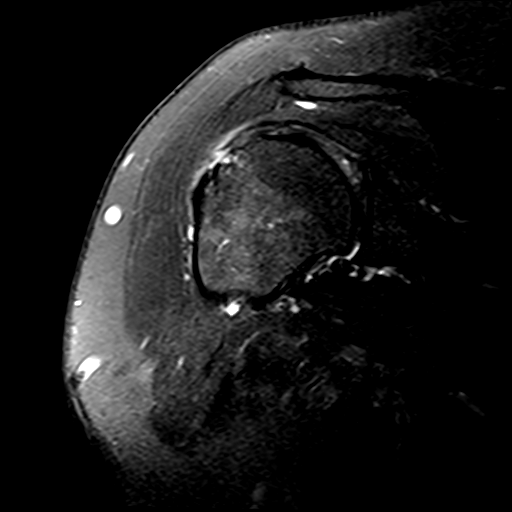
[im 17/21]
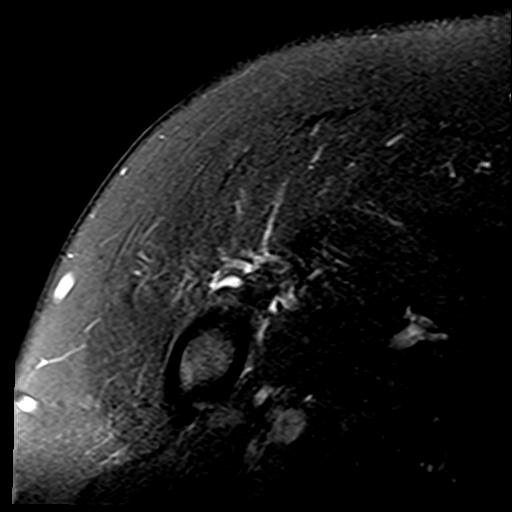

[Series 8: PD · oblique · right · 4.0mm · 0.22mm/px · 7 of 21 slices shown]
[im 1/21]
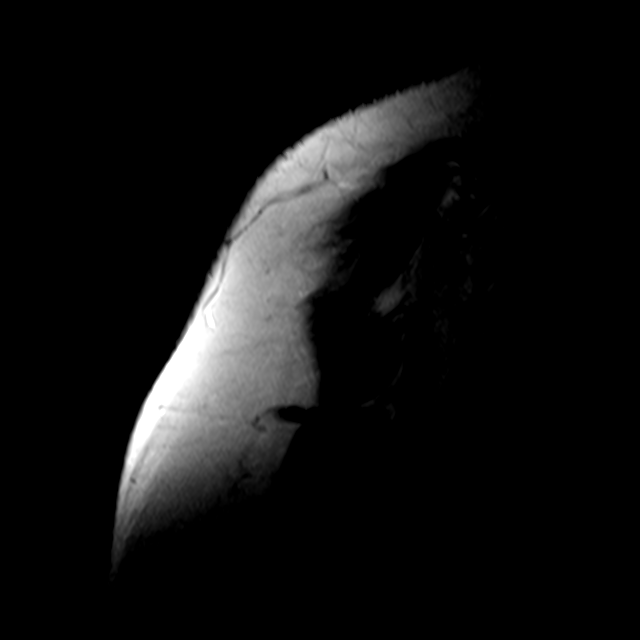
[im 4/21]
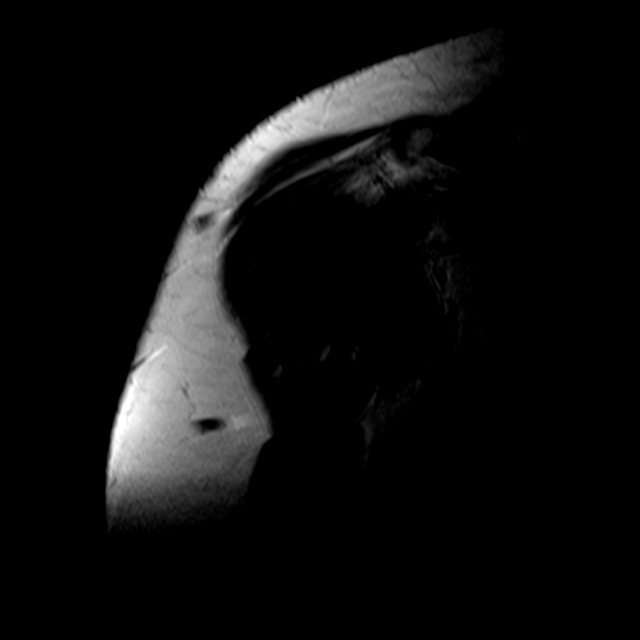
[im 7/21]
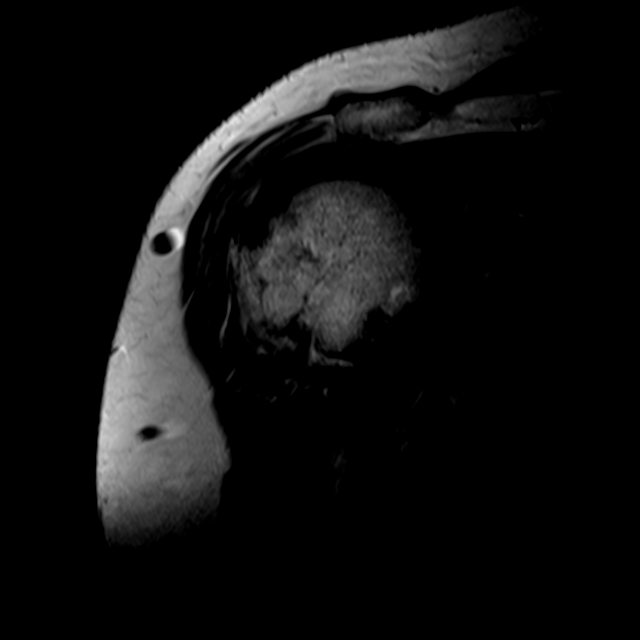
[im 11/21]
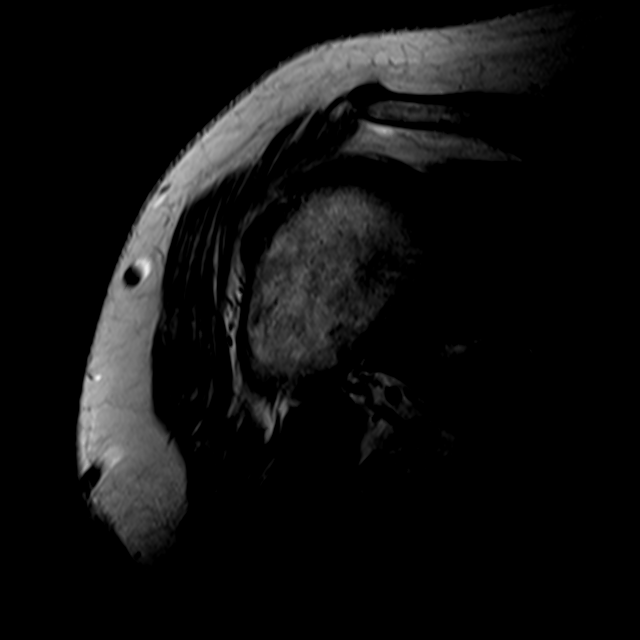
[im 14/21]
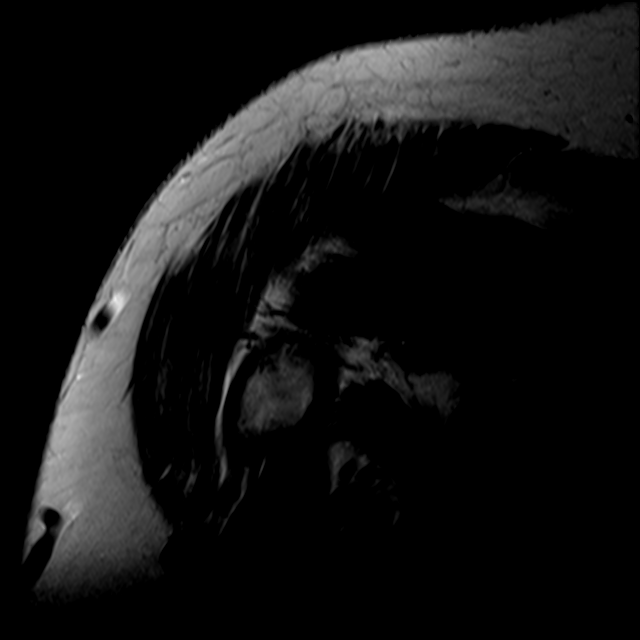
[im 17/21]
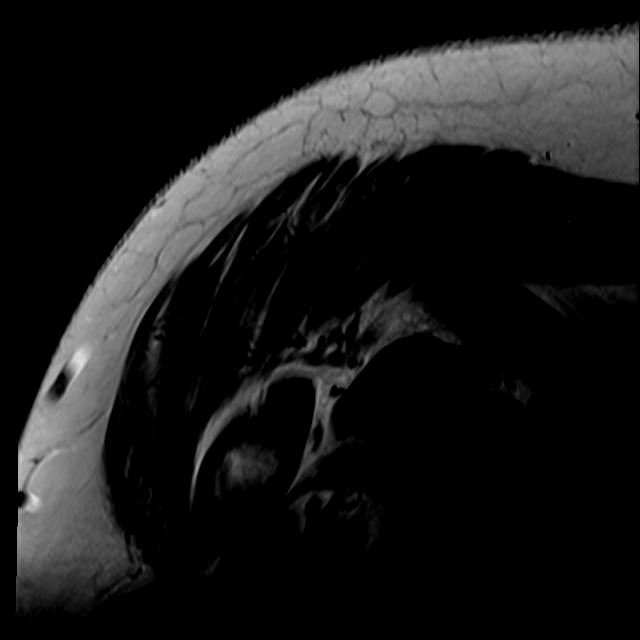
[im 21/21]
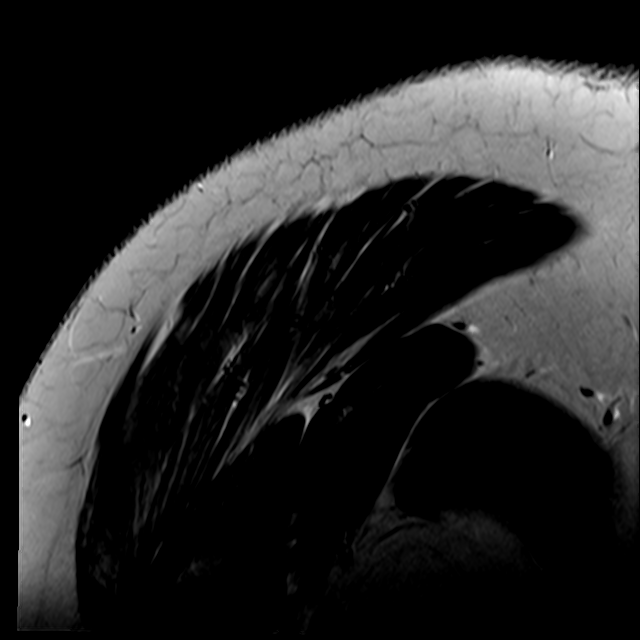

[Series 9: T2 fat-sat · oblique · right · 4.0mm · 0.55mm/px · 3 of 23 slices shown (3 of 3)]
[im 4/23]
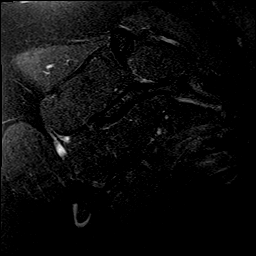
[im 13/23]
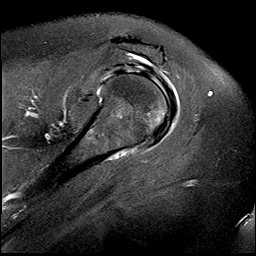
[im 19/23]
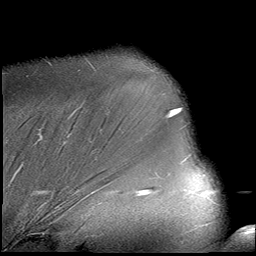

[22 of 40 positions shown; findings below may reference images not displayed]

FINDINGS: Rotator cuff: Intact. The patient has rotator cuff tendinopathy.
Calcification in the leading edge of the supraspinatus is noted.

Muscles:  Normal without atrophy or focal lesion.

Biceps long head: The tendon is completely torn from the superior
labrum and retracted below the inferior margin of the scan.

Acromioclavicular Joint: Mild osteoarthritis. Type two acromion.
Small volume of fluid is present in the subacromial/subdeltoid
bursa.

Glenohumeral Joint: Very mild degenerative change is seen.

Labrum:  The superior labrum is blunted without tear identified.

Bones: No fracture or worrisome lesion. There is some reactive
marrow signal change in the anterior greater tuberosity from rotator
cuff tendinopathy.

Other: None.
IMPRESSION: Complete tear of the long head of biceps from the superior labrum.

Intact rotator cuff with tendinopathy seen. Calcification in the
leading edge of the supraspinatus noted.

Mild acromioclavicular degenerative disease.

Small volume of subacromial/subdeltoid fluid compatible with
bursitis.

## 2020-12-28 ENCOUNTER — Ambulatory Visit
Admission: RE | Admit: 2020-12-28 | Discharge: 2020-12-28 | Disposition: A | Discharge status: Home / Self Care | Payer: Medicare PPO | Source: Ambulatory Visit | Attending: Nurse Practitioner | Admitting: Nurse Practitioner

## 2020-12-28 ENCOUNTER — Other Ambulatory Visit: Payer: Self-pay | Admitting: Nurse Practitioner

## 2020-12-28 DIAGNOSIS — K9509 Other complications of gastric band procedure: Secondary | ICD-10-CM | POA: Diagnosis not present

## 2020-12-28 DIAGNOSIS — Z9884 Bariatric surgery status: Secondary | ICD-10-CM

## 2020-12-28 IMAGING — RF DG ESOPHAGUS
8 series · 14 of 24 positions shown · non-contrast
Comparison: None.

CLINICAL DATA: History of gastric gland placement in [8W], without
recent adjustment. Request to evaluate for gastric band
complication. No acute symptoms reported.

EXAM:
ESOPHOGRAM/BARIUM SWALLOW
TECHNIQUE: Single contrast examination was performed using  thin barium.
FLUOROSCOPY TIME:  Fluoroscopy Time:  2 minutes 0 seconds
Radiation Exposure Index (if provided by the fluoroscopic device):
52 mGy
Number of Acquired Spot Images: 7

[Series 1: sequence · 2 of 9 frames shown (1 of 5)]
[frame 1/9]
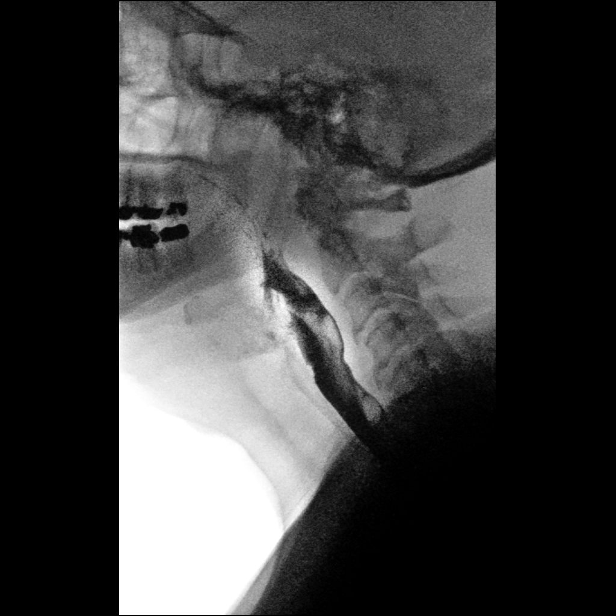
[frame 8/9]
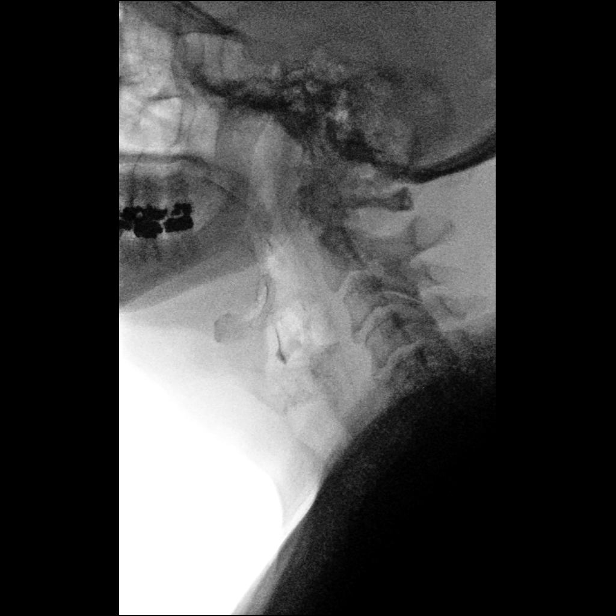

[Series 2: sequence · 1 of 36 frames shown (2 of 5)]
[frame 19/36]
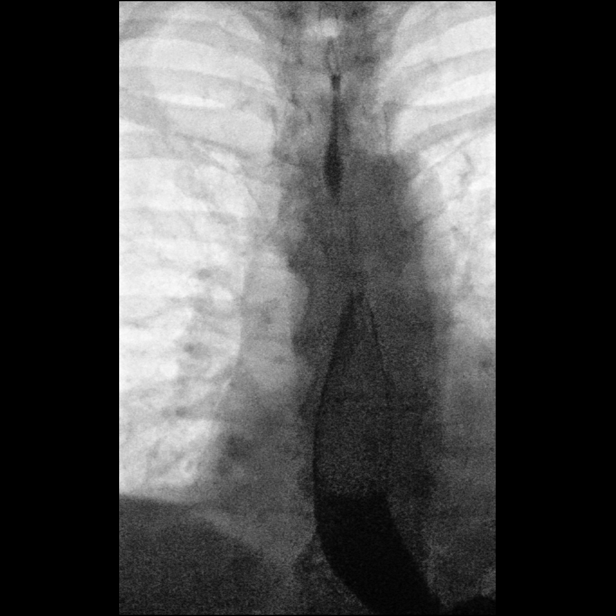

[Series 3: one shot · 1 of 1 slices shown (1 of 3)]
[im 1/1]
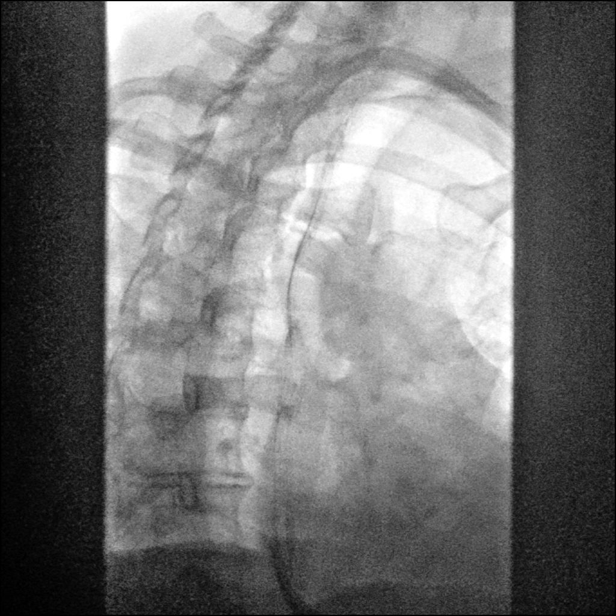

[Series 4: sequence · 2 of 152 frames shown (3 of 5)]
[frame 23/152]
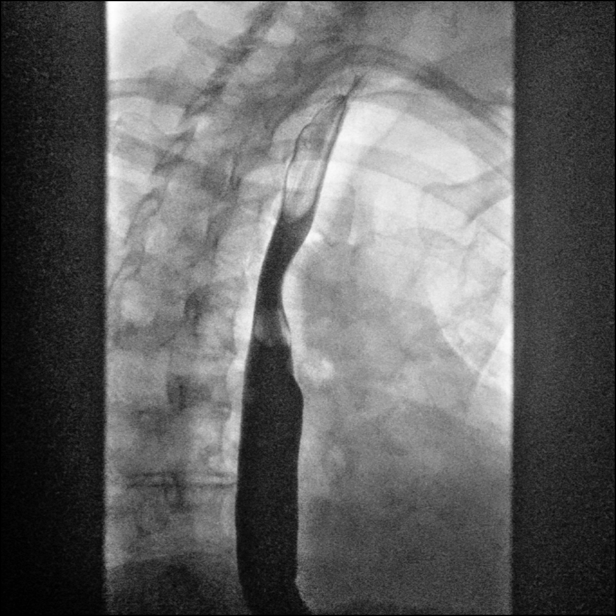
[frame 130/152]
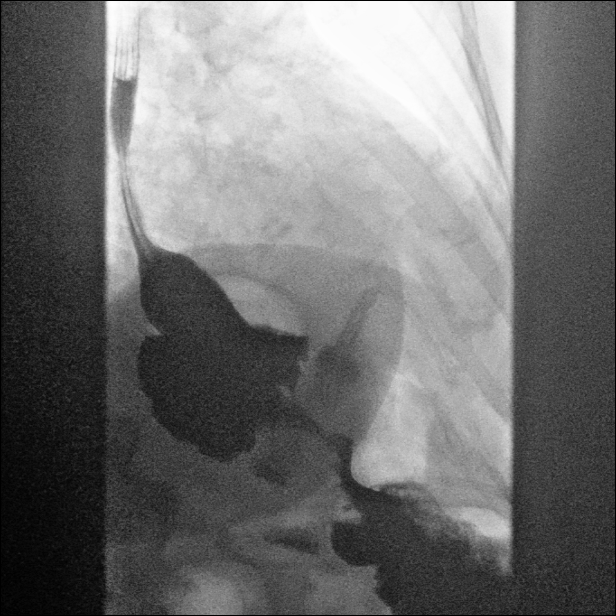

[Series 5: one shot · 0.15mm/px · 1 of 3 slices shown (2 of 3)]
[im 3/3]
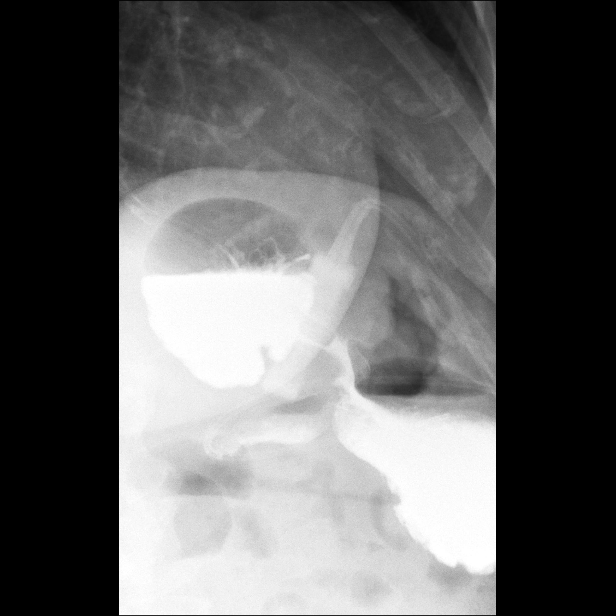

[Series 6: sequence · 2 of 80 frames shown (4 of 5)]
[frame 13/80]
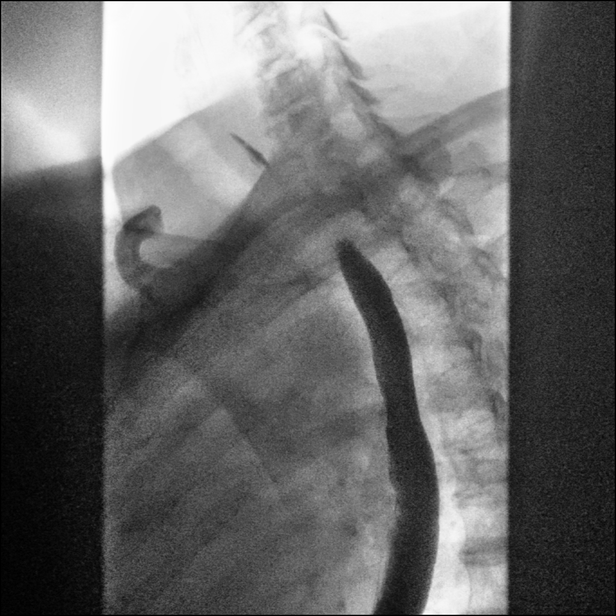
[frame 69/80]
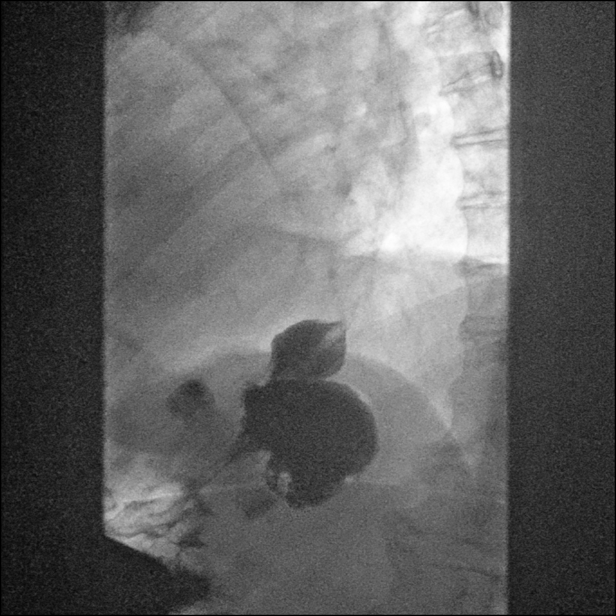

[Series 7: sequence · 1 of 59 frames shown (5 of 5)]
[frame 36/59]
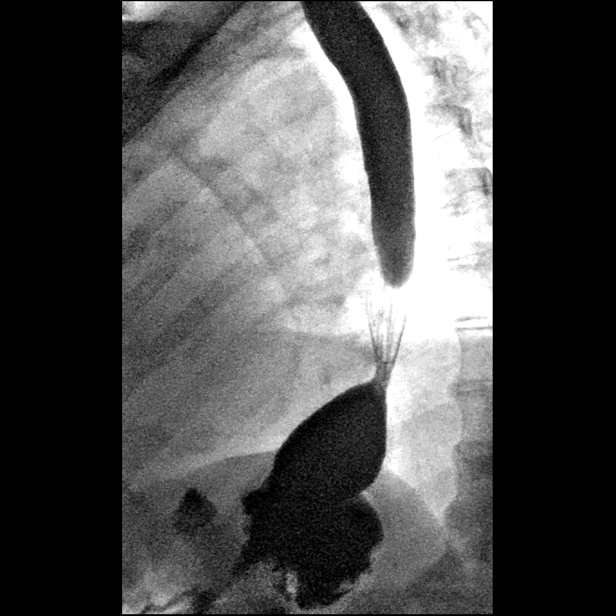

[Series 8: one shot · 0.15mm/px · 4 of 8 slices shown (3 of 3)]
[im 1/8]
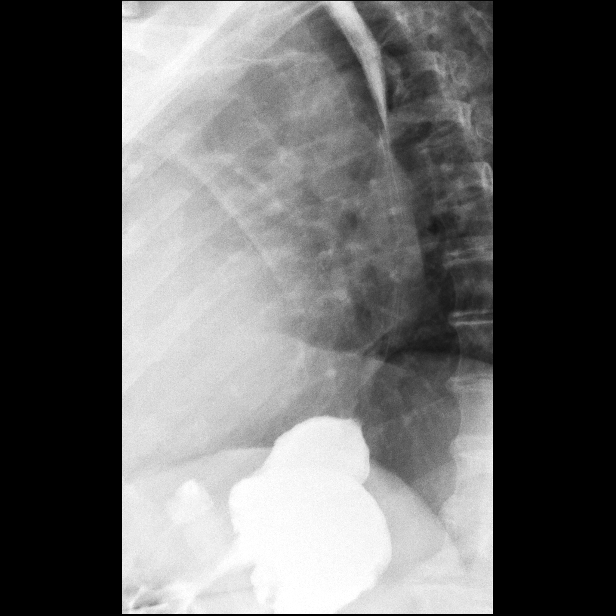
[im 3/8]
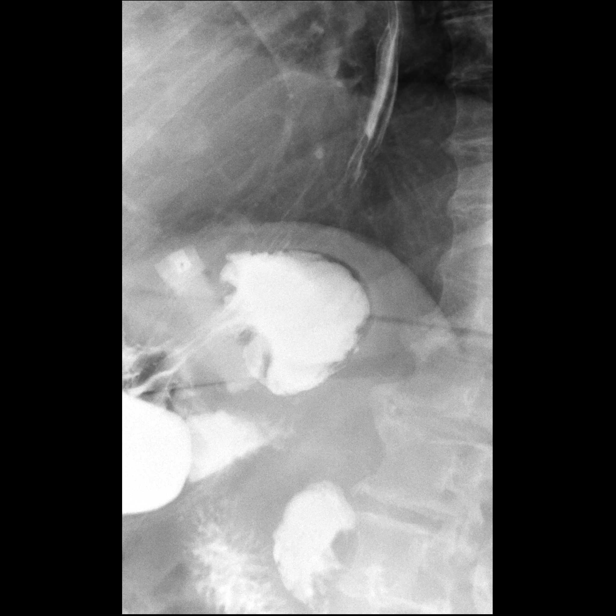
[im 5/8]
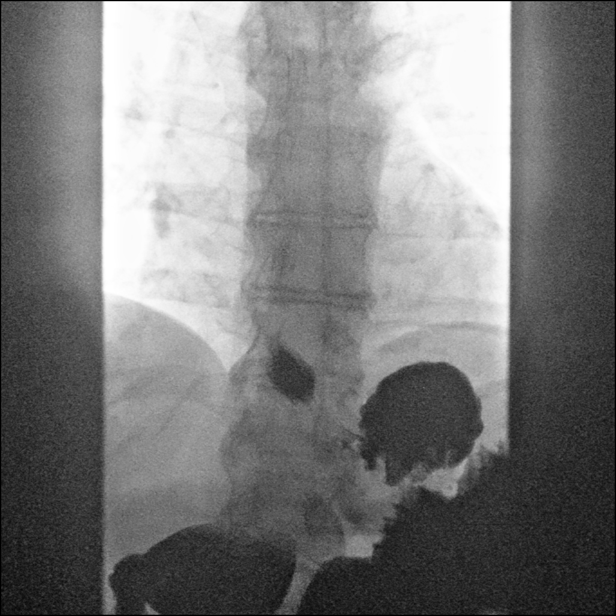
[im 8/8]
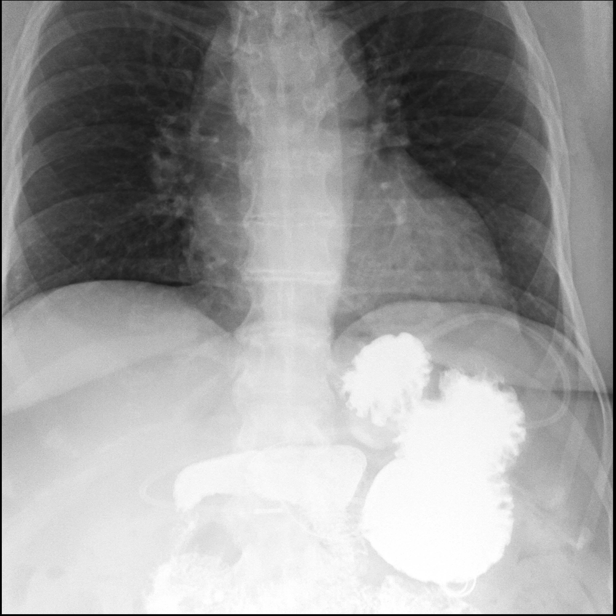

[14 of 24 positions shown; findings below may reference images not displayed]

FINDINGS: There is evidence of slippage of the gastric band, with "O sign" on
frontal view and with a large portion of the gastric cardia located
superior to the band. The "O sign" was present on the [DATE]
chest radiograph, indicating chronic slippage. Moderate to marked
smooth narrowing of the gastric lumen by the band. Mildly patulous
lower thoracic esophagus. Normal esophageal motility. Normal
esophageal distensibility, with no evidence of esophageal mass,
ulcer or stricture. No hiatal hernia. No gastroesophageal reflux
elicited.

Normal oral and pharyngeal phase of swallowing, with no laryngeal
penetration or tracheobronchial aspiration. No evidence of
pharyngeal mass, stricture or diverticulum. No evidence of
cricopharyngeus muscle dysfunction. Normal gastric emptying.
IMPRESSION: 1. Evidence of chronic slippage of the gastric band as detailed.
Moderate to marked smooth narrowing of the gastric lumen by the
band.
2. Mildly patulous lower thoracic esophagus. Normal esophageal
motility.
3. No hiatal hernia.  No gastroesophageal reflux elicited.

## 2020-12-28 NOTE — Progress Notes (Signed)
Needs appt next available

## 2020-12-29 ENCOUNTER — Other Ambulatory Visit: Payer: Self-pay

## 2020-12-29 ENCOUNTER — Ambulatory Visit: Payer: Medicare PPO | Admitting: Physical Therapy

## 2020-12-29 ENCOUNTER — Encounter: Payer: Self-pay | Admitting: Physical Therapy

## 2020-12-29 DIAGNOSIS — M25611 Stiffness of right shoulder, not elsewhere classified: Secondary | ICD-10-CM | POA: Diagnosis not present

## 2020-12-29 DIAGNOSIS — G8929 Other chronic pain: Secondary | ICD-10-CM | POA: Diagnosis not present

## 2020-12-29 DIAGNOSIS — M542 Cervicalgia: Secondary | ICD-10-CM

## 2020-12-29 DIAGNOSIS — M6281 Muscle weakness (generalized): Secondary | ICD-10-CM | POA: Diagnosis not present

## 2020-12-29 DIAGNOSIS — M25511 Pain in right shoulder: Secondary | ICD-10-CM | POA: Diagnosis not present

## 2020-12-29 NOTE — Patient Instructions (Signed)
Access Code: [email or web address] URL: [email or web address] Date: 12/29/2020 Prepared by: Narda Amber  Exercises Supine Cervical Retraction with Towel - 2-3 x daily - 7 x weekly - 10 reps - 5 seconds hold Seated Assisted Cervical Rotation with Towel - 2-3 x daily - 7 x weekly - 10 reps - 10 seconds hold Seated Cervical Sidebending AROM - 2-3 x daily - 7 x weekly - 10 reps - 10 seconds hold Supine Shoulder Flexion Extension AAROM with Dowel - 2-3 x daily - 7 x weekly - 2 sets - 10 reps - 5 seconds hold Seated Scapular Retraction - 2-3 x daily - 7 x weekly - 2 sets - 10 reps - 5 seconds hold

## 2020-12-29 NOTE — Therapy (Signed)
[facility] [address] [place], [place], [id number] Phone: [phone]   Fax:  [phone]  Physical Therapy Evaluation  Patient Details  Name: Rachel LennoxDeveta Gordon Ayers MRN: [id number] Date of Birth: [birth year] Referring Provider (PT): Jari SportsmanMary Stanbery PA-C   Encounter Date: 12/29/2020   PT End of Session - 12/29/20 1227     Visit Number 1    Number of Visits 12    Date for PT Re-Evaluation 02/12/21    Authorization Type humana    Progress Note Due on Visit 10    PT Start Time 1100    PT Stop Time 1145    PT Time Calculation (min) 45 min    Activity Tolerance Patient limited by pain    Behavior [facility] for tasks assessed/performed             Past Medical History:  Diagnosis Date   CAD (coronary artery disease)    Depression    DM (diabetes mellitus) (HCC)    Estrogen deficiency    HTN (hypertension)    Hypercholesteremia    MI (myocardial infarction) (HCC)    Morbid obesity (HCC)    OSA (obstructive sleep apnea)     History reviewed. No pertinent surgical history.  There were no vitals filed for this visit.    Subjective Assessment - 12/29/20 1101     Subjective Pt arriving to therapy reporting 4/10 pain in her neck and 10/10 pain in her right shoulder. Pt had an injection on 12/16/2020 but is not currently helpting. Pt reporting she is side sleeper and feels it didn't help longer than a few days. Pt stating the way she sleeps aggrivates her shoulder. Pt stating she went for MRI yesterday and is following up next Tuesday to discuss results.    Pertinent History CAD depression DM HTN MI, hypercholesteremia, obesity OSA    Limitations Lifting;House hold activities    Diagnostic tests MRI on 12/28/2020 :complete tear of the long head of biceps from superior labrum.  intact rotator cuff with tendionpathy, calcification in leading edge of supraspinatus noted.    Patient Stated Goals stop hurting    Currently in Pain? Yes     Pain Score 4     Pain Location Neck    Pain Orientation Left    Pain Descriptors / Indicators Aching;Sore    Pain Type Acute pain    Pain Onset More than a month ago    Pain Frequency Constant    Aggravating Factors  looking up, turning head side to side    Pain Relieving Factors change positions    Multiple Pain Sites Yes    Pain Score 10    Pain Location Shoulder    Pain Orientation Right    Pain Descriptors / Indicators Aching;Sore;Constant    Pain Type Chronic pain    Pain Onset More than a month ago    Pain Frequency Constant    Aggravating Factors  sleeping, lifting, reaching    Pain Relieving Factors topical cream    Effect of Pain on Daily Activities difficulty with household chores,                [facility] - 12/29/20 0001       Assessment   Medical Diagnosis M54.2 neck pain, chronic right shoulder pain    Referring Provider (PT) Jari SportsmanMary Stanbery PA-C    Hand Dominance Right    Prior Therapy yes      Precautions   Precautions None  Restrictions   Weight Bearing Restrictions No      Balance Screen   Has the patient fallen in the past 6 months No    Is the patient reluctant to leave their home because of a fear of falling?  No      Home Environment   Living Environment Private residence    Living Arrangements Children    Type of Home House    Home Access Level entry    Home Layout One level      Prior Function   Level of Independence Independent    Vocation Retired      IT consultant   Overall Cognitive Status Within Functional Limits for tasks assessed      Observation/Other Assessments   Focus on Therapeutic Outcomes (FOTO)  43% (predicted 59)      Posture/Postural Control   Posture/Postural Control Postural limitations    Postural Limitations Rounded Shoulders;Forward head;Increased thoracic kyphosis    Posture Comments increased cervical lordosis      ROM / Strength   AROM / PROM / Strength AROM;Strength      AROM   Overall  AROM  Deficits    Overall AROM Comments shouder ROM in supine    AROM Assessment Site Cervical;Shoulder    Right/Left Shoulder Right;Left    Right Shoulder Extension 40 Degrees    Right Shoulder Flexion 120 Degrees   painful   Right Shoulder ABduction 60 Degrees   painful   Right Shoulder Internal Rotation 60 Degrees   shoulder 45 degrees abd   Right Shoulder External Rotation 65 Degrees   shoulder 45 degrees abd   Left Shoulder Extension 40 Degrees    Left Shoulder Flexion 150 Degrees    Left Shoulder ABduction 155 Degrees    Left Shoulder Internal Rotation 65 Degrees   shoulder abd 45 degrees   Left Shoulder External Rotation 70 Degrees    Cervical Flexion 38    Cervical Extension 12    Cervical - Right Side Bend 14    Cervical - Left Side Bend 18    Cervical - Right Rotation 36    Cervical - Left Rotation 55      Strength   Overall Strength Comments right shoulder strength limited due to increased pain      Palpation   Palpation comment TTP: cervical paraspinals      Transfers   Five time sit to stand comments  15 seconds with left UE support      Ambulation/Gait   Gait Comments decreased arm swing on the right                        Objective measurements completed on examination: See above findings.       [facility] Adult PT Treatment/Exercise - 12/29/20 0001       Exercises   Exercises Neck      Neck Exercises: Theraband   Scapula Retraction 10 reps      Neck Exercises: Standing   Neck Retraction 10 reps;5 secs      Neck Exercises: Stretches   Upper Trapezius Stretch 2 reps;10 seconds    Levator Stretch 2 reps;10 seconds    Other Neck Stretches cervical rotation x 2 holding 10 seconds to each side                    PT Education - 12/29/20 1226     Education Details PT POC, HEP    Person(s)  Educated Patient    Methods Explanation;Demonstration;Tactile cues;Handout;Verbal cues    Comprehension Verbalized understanding;Returned  demonstration              PT Short Term Goals - 12/29/20 1245       PT SHORT TERM GOAL #1   Title Pt will be independent in her HEP.    Time 3    Period Weeks    Status New    Target Date 01/22/21               PT Long Term Goals - 12/29/20 1249       PT LONG TERM GOAL #1   Title Pt will be independent in her HEP and progression.    Time 6    Period Weeks    Status New    Target Date 02/12/21      PT LONG TERM GOAL #2   Title Pt will improve her cervical rotation AROM to >/= 50 degrees bilaterally.    Baseline see flowsheets    Time 6    Period Weeks    Status New    Target Date 02/12/21      PT LONG TERM GOAL #3   Title Pt will improve her FOTO score to >/= 59%.    Baseline 43%    Time 6    Period Weeks    Status New    Target Date 02/12/21      PT LONG TERM GOAL #4   Title Pt will report pain </= 2/10 with ADL's in her cervical spine.    Time 6    Period Weeks    Status New                    Plan - 12/29/20 1143     Clinical Impression Statement Pt arriving to therapy for PT evaluation of her neck pain. Pt reporting 4-6/10 in her neck and 10/10 pain in her shoulder. Pt received injection in her right shoulder which she stated did not help. Pt went for an MRI yesterday and is meeting with Dr. Roda Shutters on Tuesday 01/05/2021 to discuss results. MRI impression revealed complete tear of long head of bieps from superior labrum, intact rotator cuff with tendiopathy, calcification in leading edge of supraspinatus. Pt was issued a HEP for cervical ROM and posture correction along with AAROM of right shoulder. Skilled PT needed to address pt's impairments with the below interventions. Further assessment may be needed for pt's shoulder.    Personal Factors and Comorbidities Comorbidity 3+    Comorbidities CAD depression DM HTN MI, hypercholesteremia, obesity OSA    Examination-Activity Limitations Lift;Sleep;Reach Overhead;Carry     Examination-Participation Restrictions Community Activity;Shop;Other;Meal Prep;Laundry    Stability/Clinical Decision Making Evolving/Moderate complexity    Clinical Decision Making Moderate    Rehab Potential Fair    PT Frequency 2x / week    PT Duration 6 weeks    PT Treatment/Interventions ADLs/Self Care Home Management;Cryotherapy;Electrical Stimulation;Moist Heat;Traction;Ultrasound;Gait training;Stair training;Functional mobility training;Therapeutic activities;Therapeutic exercise;Balance training;Neuromuscular re-education;Patient/family education;Passive range of motion;Taping;Manual techniques;Dry needling;Spinal Manipulations    PT Next Visit Plan cervical stretching, manual threapy, postural exercises, shoulder ROM    PT Home Exercise Plan Access Code: [email or web address]  URL: [email or web address]  Date: 12/29/2020  Prepared by: Narda Amber    Exercises  Supine Cervical Retraction with Towel - 2-3 x daily - 7 x weekly - 10 reps - 5 seconds hold  Seated Assisted Cervical Rotation with Towel - 2-3 x daily -  7 x weekly - 10 reps - 10 seconds hold  Seated Cervical Sidebending AROM - 2-3 x daily - 7 x weekly - 10 reps - 10 seconds hold  Supine Shoulder Flexion Extension AAROM with Dowel - 2-3 x daily - 7 x weekly - 2 sets - 10 reps - 5 seconds hold  Seated Scapular Retraction - 2-3 x daily - 7 x weekly - 2 sets - 10 reps - 5 seconds hold    Consulted and Agree with Plan of Care Patient             Patient will benefit from skilled therapeutic intervention in order to improve the following deficits and impairments:  Pain, Decreased strength, Increased edema, Postural dysfunction, Impaired UE functional use, Decreased activity tolerance  Visit Diagnosis: Cervicalgia  Muscle weakness (generalized)  Chronic right shoulder pain  Stiffness of right shoulder, not elsewhere classified   Referring diagnosis? M54.2 Treatment diagnosis? (if different than referring diagnosis)  m54.2, M62.81, M25.511, M25.611 What was this (referring dx) caused by? []  Surgery []  Fall []  Ongoing issue [x]  Arthritis []  Other: ____________  Laterality: []  Rt []  Lt [x]  Both  Check all possible CPT codes:      [x]  97110 (Therapeutic Exercise)  []  (SLP Treatment)  [x]  97112 (Neuro Re-ed)   []  92526 (Swallowing Treatment)   [x]  97116 (Gait Training)   []  (Cognitive Training, 1st 15 minutes) [x]  97140 (Manual Therapy)   []  97130 (Cognitive Training, each add'l 15 minutes)  [x]  97530 (Therapeutic Activities)  []  Other, List CPT Code ____________    [x]  97535 (Self Care)       []  All codes above (97110 - 97535)  []  97012 (Mechanical Traction)  [x]  97014 (E-stim Unattended)  []  97032 (E-stim manual)  [x]  97033 (Ionto)  [x]  97035 (Ultrasound)  []  97760 (Orthotic Fit) []  (Physical Performance Training) []  (Aquatic Therapy) []  97034 (Contrast Bath) []  (Paraffin) []  97597 (Wound Care 1st 20 sq cm) []  97598 (Wound Care each add'l 20 sq cm) [x]  97016 (Vasopneumatic Device) []  (Orthotic Training) []  (Prosthetic Training)   Problem List Patient Active Problem List   Diagnosis Date Noted   Obesity, diabetes, and hypertension syndrome (HCC) 06/19/2020   Mixed hyperlipidemia 06/19/2020   Aortic valve sclerosis 06/19/2020   Atrial septal aneurysm 06/19/2020   Strain of calf muscle 04/10/2020   Takotsubo cardiomyopathy 02/26/2020   PVC (premature ventricular contraction) 02/26/2020   PAC (premature atrial contraction) 02/26/2020   Chest pain of uncertain etiology 02/26/2020   Diabetes mellitus with coincident hypertension (HCC) 10/18/2019    , PT, MPT 12/29/2020, 4:30 PM  [facility] Physical Therapy [address] [place], , Phone: [phone]   Fax:  [phone]  Name: Rachel Ayers MRN: Date of Birth: July 11, 1955

## 2020-12-29 NOTE — Progress Notes (Signed)
Appt made

## 2021-01-05 ENCOUNTER — Ambulatory Visit: Payer: Medicare PPO | Admitting: Orthopaedic Surgery

## 2021-01-05 ENCOUNTER — Encounter: Payer: Self-pay | Admitting: Orthopaedic Surgery

## 2021-01-05 DIAGNOSIS — M542 Cervicalgia: Secondary | ICD-10-CM | POA: Diagnosis not present

## 2021-01-05 DIAGNOSIS — M25511 Pain in right shoulder: Secondary | ICD-10-CM | POA: Diagnosis not present

## 2021-01-05 DIAGNOSIS — G8929 Other chronic pain: Secondary | ICD-10-CM

## 2021-01-05 MED ORDER — DIAZEPAM 5 MG PO TABS: 5.0000 mg | ORAL_TABLET | Freq: Once | ORAL | 0 refills | Status: AC

## 2021-01-05 NOTE — Addendum Note (Signed)
Addended by: Albertina Parr on: 01/05/2021 02:11 PM   Modules accepted: Orders

## 2021-01-05 NOTE — Progress Notes (Signed)
Office Visit Note   Patient: Rachel Ayers           Date of Birth: Apr 29, 1956           MRN: [id number] Visit Date: 01/05/2021              Requested by: Darrow Bussing, MD [address] [place],  [place] [address] PCP: Darrow Bussing, MD   Assessment & Plan: Visit Diagnoses:  1. Chronic right shoulder pain     Plan: MRI does show partial-thickness tearing of the rotator cuff.  She has had prior biceps tendon tear which is retracted.  Overall the pain seems to be out of proportion with the MRI findings therefore I would like to first rule out her cervical spine as a source of her symptoms and therefore we will order an MRI of the cervical spine.  We will have her come back afterwards.  Follow-Up Instructions: Return if symptoms worsen or fail to improve.   Orders:  No orders of the defined types were placed in this encounter.  Meds ordered this encounter  Medications   diazepam (VALIUM) 5 MG tablet    Sig: Take 1-2 tablets (5-10 mg total) by mouth once for 1 dose.    Dispense:  2 tablet    Refill:  0      Procedures: No procedures performed   Clinical Data: No additional findings.   Subjective: Chief Complaint  Patient presents with   Right Shoulder - Pain    Rachel Ayers returns today for right shoulder MRI review.  She continues to have pain with shoulder abduction is worsened with activity and use.   Review of Systems  Constitutional: Negative.   HENT: Negative.    Eyes: Negative.   Respiratory: Negative.    Cardiovascular: Negative.   Endocrine: Negative.   Musculoskeletal: Negative.   Neurological: Negative.   Hematological: Negative.   Psychiatric/Behavioral: Negative.    All other systems reviewed and are negative.   Objective: Vital Signs: There were no vitals taken for this visit.  Physical Exam Vitals and nursing note reviewed.  Constitutional:      Appearance: She is well-developed.  Pulmonary:     Effort:  Pulmonary effort is normal.  Skin:    General: Skin is warm.     Capillary Refill: Capillary refill takes less than 2 seconds.  Neurological:     Mental Status: She is alert and oriented to person, place, and time.  Psychiatric:        Behavior: Behavior normal.        Thought Content: Thought content normal.        Judgment: Judgment normal.    Ortho Exam Right shoulder strength testing is limited by guarding and pain.  Range of motion is mildly limited. Specialty Comments:  No specialty comments available.  Imaging: No results found.   PMFS History: Patient Active Problem List   Diagnosis Date Noted   Obesity, diabetes, and hypertension syndrome (HCC) 06/19/2020   Mixed hyperlipidemia 06/19/2020   Aortic valve sclerosis 06/19/2020   Atrial septal aneurysm 06/19/2020   Strain of calf muscle 04/10/2020   Takotsubo cardiomyopathy 02/26/2020   PVC (premature ventricular contraction) 02/26/2020   PAC (premature atrial contraction) 02/26/2020   Chest pain of uncertain etiology 02/26/2020   Diabetes mellitus with coincident hypertension (HCC) 10/18/2019   Past Medical History:  Diagnosis Date   CAD (coronary artery disease)    Depression    DM (diabetes mellitus) (  HCC)    Estrogen deficiency    HTN (hypertension)    Hypercholesteremia    MI (myocardial infarction) (HCC)    Morbid obesity (HCC)    OSA (obstructive sleep apnea)     Family History  Problem Relation Age of Onset   Diabetes Father     History reviewed. No pertinent surgical history. Social History   Occupational History   Not on file  Tobacco Use   Smoking status: Never   Smokeless tobacco: Never  Substance and Sexual Activity   Alcohol use: Never   Drug use: Never   Sexual activity: Never

## 2021-01-07 DIAGNOSIS — K9509 Other complications of gastric band procedure: Secondary | ICD-10-CM | POA: Diagnosis not present

## 2021-01-12 ENCOUNTER — Other Ambulatory Visit: Payer: Self-pay

## 2021-01-12 ENCOUNTER — Encounter: Payer: Self-pay | Admitting: Physical Therapy

## 2021-01-12 ENCOUNTER — Ambulatory Visit: Payer: Medicare PPO | Admitting: Physical Therapy

## 2021-01-12 DIAGNOSIS — Z713 Dietary counseling and surveillance: Secondary | ICD-10-CM | POA: Diagnosis not present

## 2021-01-12 DIAGNOSIS — M25511 Pain in right shoulder: Secondary | ICD-10-CM | POA: Diagnosis not present

## 2021-01-12 DIAGNOSIS — G8929 Other chronic pain: Secondary | ICD-10-CM | POA: Diagnosis not present

## 2021-01-12 DIAGNOSIS — K9509 Other complications of gastric band procedure: Secondary | ICD-10-CM | POA: Diagnosis not present

## 2021-01-12 DIAGNOSIS — M542 Cervicalgia: Secondary | ICD-10-CM

## 2021-01-12 DIAGNOSIS — I10 Essential (primary) hypertension: Secondary | ICD-10-CM | POA: Diagnosis not present

## 2021-01-12 DIAGNOSIS — F419 Anxiety disorder, unspecified: Secondary | ICD-10-CM | POA: Diagnosis not present

## 2021-01-12 DIAGNOSIS — E119 Type 2 diabetes mellitus without complications: Secondary | ICD-10-CM | POA: Diagnosis not present

## 2021-01-12 DIAGNOSIS — M6281 Muscle weakness (generalized): Secondary | ICD-10-CM

## 2021-01-12 DIAGNOSIS — M25611 Stiffness of right shoulder, not elsewhere classified: Secondary | ICD-10-CM

## 2021-01-12 NOTE — Therapy (Signed)
[facility] Physical Therapy [address] [place], [place], [address] Phone: [phone]   Fax:  [phone]  Physical Therapy Treatment  Patient Details  Name: Rachel Ayers MRN: [id number] Date of Birth: [birth year] Referring Provider (PT): Jari Sportsman PA-C   Encounter Date: 01/12/2021   PT End of Session - 01/12/21 1240     Visit Number 2    Number of Visits 12    Date for PT Re-Evaluation 02/12/21    Authorization Type humana 12/28/2020 - 02/28/2021    Progress Note Due on Visit 10    PT Start Time 1100    PT Stop Time 1140    PT Time Calculation (min) 40 min    Activity Tolerance Patient limited by pain    Behavior During Therapy [facility] for tasks assessed/performed             Past Medical History:  Diagnosis Date   CAD (coronary artery disease)    Depression    DM (diabetes mellitus) (HCC)    Estrogen deficiency    HTN (hypertension)    Hypercholesteremia    MI (myocardial infarction) (HCC)    Morbid obesity (HCC)    OSA (obstructive sleep apnea)     History reviewed. No pertinent surgical history.  There were no vitals filed for this visit.   Subjective Assessment - 01/12/21 1149     Subjective Pt arriving today reporting 3/10 pain in her neck. Pt reporting her right shoulder is feeling much better and feels she must have slept "funny" on it to cause the pain to increase.    Pertinent History CAD depression DM HTN MI, hypercholesteremia, obesity OSA    Limitations Lifting;House hold activities    Diagnostic tests MRI on 12/28/2020 :complete tear of the long head of biceps from superior labrum.  intact rotator cuff with tendionpathy, calcification in leading edge of supraspinatus noted.    Currently in Pain? Yes    Pain Score 3     Pain Location Neck    Pain Orientation Lower    Pain Descriptors / Indicators Sore;Aching    Pain Type Acute pain    Pain Onset More than a month ago                                [facility] Adult PT Treatment/Exercise - 01/12/21 0001       Exercises   Exercises Neck      Neck Exercises: Machines for Strengthening   UBE (Upper Arm Bike) L1 6 minutes (3 minutes forward/back)      Neck Exercises: Theraband   Scapula Retraction 10 reps    Rows 10 reps;Green;Other (comment)    Rows Limitations 2 sets      Neck Exercises: Standing   Neck Retraction 10 reps;5 secs      Neck Exercises: Seated   Money 3 secs;5 reps    Money Limitations attempted with red theraband, pt reproting increased pain in anterior shoulder      Neck Exercises: Supine   Cervical Isometrics Flexion;Extension;Left rotation;Right rotation;5 secs;5 reps    Other Supine Exercise AAROM: shoulder flexion with 1# bar 2 x10, ER 2x10,      Neck Exercises: Stretches   Upper Trapezius Stretch 3 reps;10 seconds    Levator Stretch 3 reps;10 seconds    Other Neck Stretches cervical rotation x 2 holding 10 seconds to each side      Manual Therapy  Manual therapy comments Percussion to cervical paraspinals and right upper trap, active trigger point release to right upper trap,                      PT Short Term Goals - 12/29/20 1245       PT SHORT TERM GOAL #1   Title Pt will be independent in her HEP.    Time 3    Period Weeks    Status New    Target Date 01/22/21               PT Long Term Goals - 12/29/20 1249       PT LONG TERM GOAL #1   Title Pt will be independent in her HEP and progression.    Time 6    Period Weeks    Status New    Target Date 02/12/21      PT LONG TERM GOAL #2   Title Pt will improve her cervical rotation AROM to >/= 50 degrees bilaterally.    Baseline see flowsheets    Time 6    Period Weeks    Status New    Target Date 02/12/21      PT LONG TERM GOAL #3   Title Pt will improve her FOTO score to >/= 59%.    Baseline 43%    Time 6    Period Weeks    Status New    Target Date 02/12/21      PT  LONG TERM GOAL #4   Title Pt will report pain </= 2/10 with ADL's in her cervical spine.    Time 6    Period Weeks    Status New                   Plan - 01/12/21 1242     Clinical Impression Statement Pt reproting 3/10 pain upon arrival. Pt stating her shoulder is doing much better. Pt tolerating cervical stretching, shoulder AAROM and beginning strengthening exercises. Pt stating she is scheduled for cervical MRI. Pt reporting improvement in cervical ROM following manual therapy. Continue skilled PT as pt tolerates.    Personal Factors and Comorbidities Comorbidity 3+    Comorbidities CAD depression DM HTN MI, hypercholesteremia, obesity OSA    Examination-Activity Limitations Lift;Sleep;Reach Overhead;Carry    Examination-Participation Restrictions Community Activity;Shop;Other;Meal Prep;Laundry    Stability/Clinical Decision Making Evolving/Moderate complexity    Rehab Potential Fair    PT Frequency 2x / week    PT Duration 6 weeks    PT Treatment/Interventions ADLs/Self Care Home Management;Cryotherapy;Electrical Stimulation;Moist Heat;Traction;Ultrasound;Gait training;Stair training;Functional mobility training;Therapeutic activities;Therapeutic exercise;Balance training;Neuromuscular re-education;Patient/family education;Passive range of motion;Taping;Manual techniques;Dry needling;Spinal Manipulations    PT Next Visit Plan cervical stretching, manual threapy (percussion , cervical mobs), postural exercises, shoulder ROM    PT Home Exercise Plan Access Code: [email or web address]  URL: [email or web address]  Date: 12/29/2020  Prepared by: Narda Amber    Exercises  Supine Cervical Retraction with Towel - 2-3 x daily - 7 x weekly - 10 reps - 5 seconds hold  Seated Assisted Cervical Rotation with Towel - 2-3 x daily - 7 x weekly - 10 reps - 10 seconds hold  Seated Cervical Sidebending AROM - 2-3 x daily - 7 x weekly - 10 reps - 10 seconds hold  Supine Shoulder Flexion Extension  AAROM with Dowel - 2-3 x daily - 7 x weekly - 2 sets - 10 reps - 5 seconds hold  Seated Scapular Retraction -  2-3 x daily - 7 x weekly - 2 sets - 10 reps - 5 seconds hold    Consulted and Agree with Plan of Care Patient             Patient will benefit from skilled therapeutic intervention in order to improve the following deficits and impairments:  Pain, Decreased strength, Increased edema, Postural dysfunction, Impaired UE functional use, Decreased activity tolerance  Visit Diagnosis: Cervicalgia  Muscle weakness (generalized)  Chronic right shoulder pain  Stiffness of right shoulder, not elsewhere classified     Problem List Patient Active Problem List   Diagnosis Date Noted   Obesity, diabetes, and hypertension syndrome (HCC) 06/19/2020   Mixed hyperlipidemia 06/19/2020   Aortic valve sclerosis 06/19/2020   Atrial septal aneurysm 06/19/2020   Strain of calf muscle 04/10/2020   Takotsubo cardiomyopathy 02/26/2020   PVC (premature ventricular contraction) 02/26/2020   PAC (premature atrial contraction) 02/26/2020   Chest pain of uncertain etiology 02/26/2020   Diabetes mellitus with coincident hypertension (HCC) 10/18/2019    Sharmon Leyden, PT, MPT 01/12/2021, 12:45 PM  [facility] Physical Therapy [address] [place], [place], [address] Phone: [phone]   Fax:  [phone]  Name: Rachel Ayers MRN: [id number] Date of Birth: September 22, 1955

## 2021-01-15 ENCOUNTER — Encounter: Payer: Medicare PPO | Admitting: Rehabilitative and Restorative Service Providers"

## 2021-01-15 DIAGNOSIS — E669 Obesity, unspecified: Secondary | ICD-10-CM | POA: Diagnosis not present

## 2021-01-15 DIAGNOSIS — I129 Hypertensive chronic kidney disease with stage 1 through stage 4 chronic kidney disease, or unspecified chronic kidney disease: Secondary | ICD-10-CM | POA: Diagnosis not present

## 2021-01-15 DIAGNOSIS — E1122 Type 2 diabetes mellitus with diabetic chronic kidney disease: Secondary | ICD-10-CM | POA: Diagnosis not present

## 2021-01-15 DIAGNOSIS — N1831 Chronic kidney disease, stage 3a: Secondary | ICD-10-CM | POA: Diagnosis not present

## 2021-01-15 DIAGNOSIS — E785 Hyperlipidemia, unspecified: Secondary | ICD-10-CM | POA: Diagnosis not present

## 2021-01-18 ENCOUNTER — Encounter: Payer: Self-pay | Admitting: Physical Therapy

## 2021-01-18 ENCOUNTER — Ambulatory Visit: Payer: Medicare PPO | Admitting: Physical Therapy

## 2021-01-18 ENCOUNTER — Other Ambulatory Visit: Payer: Self-pay

## 2021-01-18 DIAGNOSIS — M6281 Muscle weakness (generalized): Secondary | ICD-10-CM

## 2021-01-18 DIAGNOSIS — M25511 Pain in right shoulder: Secondary | ICD-10-CM | POA: Diagnosis not present

## 2021-01-18 DIAGNOSIS — M25571 Pain in right ankle and joints of right foot: Secondary | ICD-10-CM

## 2021-01-18 DIAGNOSIS — G8929 Other chronic pain: Secondary | ICD-10-CM

## 2021-01-18 DIAGNOSIS — R262 Difficulty in walking, not elsewhere classified: Secondary | ICD-10-CM | POA: Diagnosis not present

## 2021-01-18 DIAGNOSIS — M542 Cervicalgia: Secondary | ICD-10-CM | POA: Diagnosis not present

## 2021-01-18 DIAGNOSIS — M25611 Stiffness of right shoulder, not elsewhere classified: Secondary | ICD-10-CM

## 2021-01-18 NOTE — Therapy (Signed)
[facility] Physical Therapy [address] [place], [place], [address] Phone: [phone]   Fax:  [phone]  Physical Therapy Treatment  Patient Details  Name: Rachel Ayers MRN: [id number] Date of Birth: December 10, 1955 Referring Provider (PT): Jari Sportsman PA-C   Encounter Date: 01/18/2021   PT End of Session - 01/18/21 1358     Visit Number 3    Number of Visits 12    Date for PT Re-Evaluation 02/12/21    Authorization Type humana 12/28/2020 - 02/28/2021 12 visits    Progress Note Due on Visit 10    PT Start Time 1302    PT Stop Time 1340    PT Time Calculation (min) 38 min    Activity Tolerance Patient limited by pain    Behavior During Therapy [facility] for tasks assessed/performed             Past Medical History:  Diagnosis Date   CAD (coronary artery disease)    Depression    DM (diabetes mellitus) (HCC)    Estrogen deficiency    HTN (hypertension)    Hypercholesteremia    MI (myocardial infarction) (HCC)    Morbid obesity (HCC)    OSA (obstructive sleep apnea)     History reviewed. No pertinent surgical history.  There were no vitals filed for this visit.   Subjective Assessment - 01/18/21 1356     Subjective Pt arriving today reporting 3/10 neck pain which is much better than a few weeks ago. Pt reporting compliance in her HEP.    Pertinent History CAD depression DM HTN MI, hypercholesteremia, obesity OSA    Limitations Lifting;House hold activities    Diagnostic tests MRI on 12/28/2020 :complete tear of the long head of biceps from superior labrum.  intact rotator cuff with tendionpathy, calcification in leading edge of supraspinatus noted.    Patient Stated Goals stop hurting    Currently in Pain? Yes    Pain Score 3     Pain Location Neck    Pain Orientation Lower    Pain Descriptors / Indicators Sore;Discomfort    Pain Type Acute pain    Pain Onset More than a month ago                               [facility]  Adult PT Treatment/Exercise - 01/18/21 0001       Exercises   Exercises Neck      Neck Exercises: Machines for Strengthening   UBE (Upper Arm Bike) L2 x 7 minutes      Neck Exercises: Theraband   Rows --    Rows Limitations --      Neck Exercises: Standing   Neck Retraction 10 reps;5 secs      Neck Exercises: Supine   Other Supine Exercise AAROM: shoulder flexion with 1# bar 2 x10, ER 2x10,      Neck Exercises: Sidelying   Other Sidelying Exercise shoulder external rotation with 2# weight 2x10      Neck Exercises: Stretches   Upper Trapezius Stretch 3 reps;10 seconds    Levator Stretch 3 reps;10 seconds    Corner Stretch 5 reps;10 seconds      Manual Therapy   Manual therapy comments IASTM to right upper trap and levator, percussion to right upper trap/ levator/ lateral shoulder and cervical paraspinals   20 minutes  PT Short Term Goals - 01/18/21 1402       PT SHORT TERM GOAL #1   Title Pt will be independent in her HEP.    Status On-going               PT Long Term Goals - 01/18/21 1402       PT LONG TERM GOAL #1   Title Pt will be independent in her HEP and progression.    Status On-going      PT LONG TERM GOAL #2   Title Pt will improve her cervical rotation AROM to >/= 50 degrees bilaterally.    Status On-going      PT LONG TERM GOAL #3   Title Pt will improve her FOTO score to >/= 59%.    Status On-going      PT LONG TERM GOAL #4   Title Pt will report pain </= 2/10 with ADL's in her cervical spine.    Status On-going                   Plan - 01/18/21 1359     Clinical Impression Statement Pt reporting 3/10 pain in right upper trap and levator. Pt stating her shoulder feels much better requesting percussion to right shoulder and neck. Pt tolerating exercises and manual therapy reporting less stiffness at end of session. Continue skilled PT.    Personal Factors and Comorbidities Comorbidity 3+     Comorbidities CAD depression DM HTN MI, hypercholesteremia, obesity OSA    Examination-Activity Limitations Lift;Sleep;Reach Overhead;Carry    Examination-Participation Restrictions Community Activity;Shop;Other;Meal Prep;Laundry    Stability/Clinical Decision Making Evolving/Moderate complexity    Rehab Potential Fair    PT Frequency 2x / week    PT Duration 6 weeks    PT Treatment/Interventions ADLs/Self Care Home Management;Cryotherapy;Electrical Stimulation;Moist Heat;Traction;Ultrasound;Gait training;Stair training;Functional mobility training;Therapeutic activities;Therapeutic exercise;Balance training;Neuromuscular re-education;Patient/family education;Passive range of motion;Taping;Manual techniques;Dry needling;Spinal Manipulations    PT Next Visit Plan continuing cervical stretching, manual threapy (percussion , cervical mobs), postural exercises, shoulder ROM    PT Home Exercise Plan Access Code: [email or web address]  URL: [email or web address]  Date: 12/29/2020  Prepared by: Narda Amber    Exercises  Supine Cervical Retraction with Towel - 2-3 x daily - 7 x weekly - 10 reps - 5 seconds hold  Seated Assisted Cervical Rotation with Towel - 2-3 x daily - 7 x weekly - 10 reps - 10 seconds hold  Seated Cervical Sidebending AROM - 2-3 x daily - 7 x weekly - 10 reps - 10 seconds hold  Supine Shoulder Flexion Extension AAROM with Dowel - 2-3 x daily - 7 x weekly - 2 sets - 10 reps - 5 seconds hold  Seated Scapular Retraction - 2-3 x daily - 7 x weekly - 2 sets - 10 reps - 5 seconds hold    Consulted and Agree with Plan of Care Patient             Patient will benefit from skilled therapeutic intervention in order to improve the following deficits and impairments:  Pain, Decreased strength, Increased edema, Postural dysfunction, Impaired UE functional use, Decreased activity tolerance  Visit Diagnosis: Cervicalgia  Muscle weakness (generalized)  Chronic right shoulder  pain  Stiffness of right shoulder, not elsewhere classified  Pain in right ankle and joints of right foot  Difficulty in walking, not elsewhere classified     Problem List Patient Active Problem List   Diagnosis Date Noted   Obesity, diabetes, and hypertension  syndrome (HCC) 06/19/2020   Mixed hyperlipidemia 06/19/2020   Aortic valve sclerosis 06/19/2020   Atrial septal aneurysm 06/19/2020   Strain of calf muscle 04/10/2020   Takotsubo cardiomyopathy 02/26/2020   PVC (premature ventricular contraction) 02/26/2020   PAC (premature atrial contraction) 02/26/2020   Chest pain of uncertain etiology 02/26/2020   Diabetes mellitus with coincident hypertension (HCC) 10/18/2019    Sharmon Leyden, PT, MPT 01/18/2021, 2:03 PM  [facility] Physical Therapy [address] [place], [place], [address] Phone: [phone]   Fax:  [phone]  Name: Rachel Ayers MRN: [id number] Date of Birth: April 13, 1956

## 2021-01-19 DIAGNOSIS — G4733 Obstructive sleep apnea (adult) (pediatric): Secondary | ICD-10-CM | POA: Diagnosis not present

## 2021-01-20 ENCOUNTER — Encounter: Payer: Self-pay | Admitting: Physical Therapy

## 2021-01-20 ENCOUNTER — Other Ambulatory Visit: Payer: Self-pay | Admitting: Internal Medicine

## 2021-01-20 ENCOUNTER — Ambulatory Visit: Payer: Medicare PPO | Admitting: Physical Therapy

## 2021-01-20 ENCOUNTER — Other Ambulatory Visit: Payer: Self-pay

## 2021-01-20 ENCOUNTER — Ambulatory Visit
Admission: RE | Admit: 2021-01-20 | Discharge: 2021-01-20 | Disposition: A | Discharge status: Home / Self Care | Payer: Medicare PPO | Source: Ambulatory Visit | Attending: Internal Medicine | Admitting: Internal Medicine

## 2021-01-20 DIAGNOSIS — G8929 Other chronic pain: Secondary | ICD-10-CM | POA: Diagnosis not present

## 2021-01-20 DIAGNOSIS — M25611 Stiffness of right shoulder, not elsewhere classified: Secondary | ICD-10-CM | POA: Diagnosis not present

## 2021-01-20 DIAGNOSIS — I129 Hypertensive chronic kidney disease with stage 1 through stage 4 chronic kidney disease, or unspecified chronic kidney disease: Secondary | ICD-10-CM

## 2021-01-20 DIAGNOSIS — N1831 Chronic kidney disease, stage 3a: Secondary | ICD-10-CM

## 2021-01-20 DIAGNOSIS — M25511 Pain in right shoulder: Secondary | ICD-10-CM | POA: Diagnosis not present

## 2021-01-20 DIAGNOSIS — M542 Cervicalgia: Secondary | ICD-10-CM | POA: Diagnosis not present

## 2021-01-20 DIAGNOSIS — M6281 Muscle weakness (generalized): Secondary | ICD-10-CM | POA: Diagnosis not present

## 2021-01-20 IMAGING — US US RENAL
1 series · 14 of 25 positions shown · non-contrast
Comparison: None available.

CLINICAL DATA: Initial evaluation for stage III A chronic kidney
disease.

EXAM:
RENAL / URINARY TRACT ULTRASOUND COMPLETE

[Series 1: us renal · 0.25mm/px · 14 of 31 slices shown]
[im 1/31]
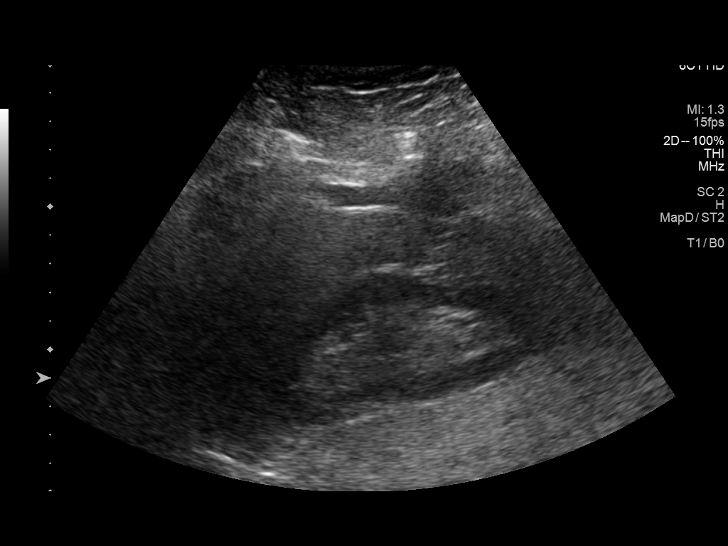
[im 3/31]
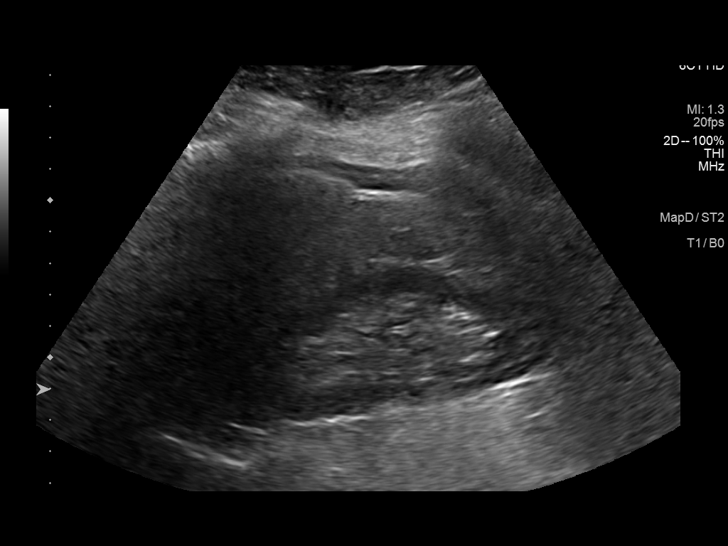
[im 6/31]
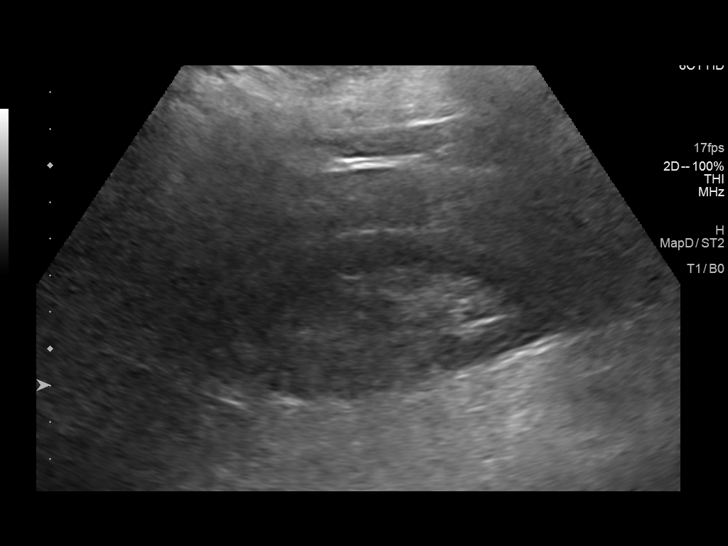
[im 8/31]
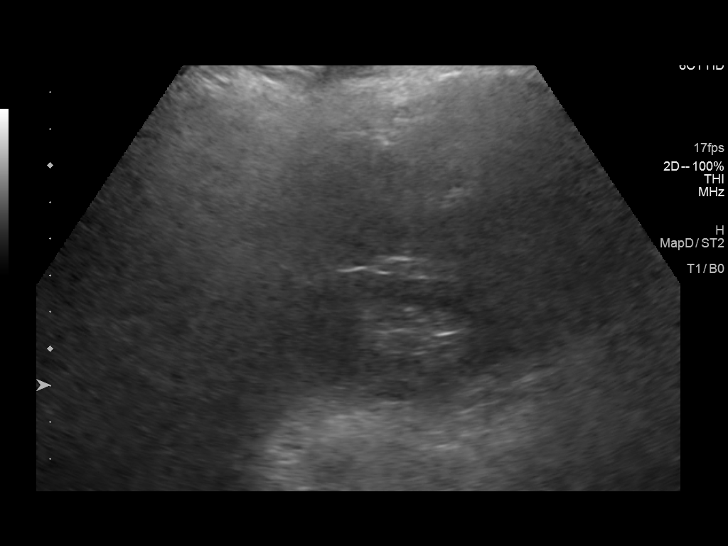
[im 11/31]
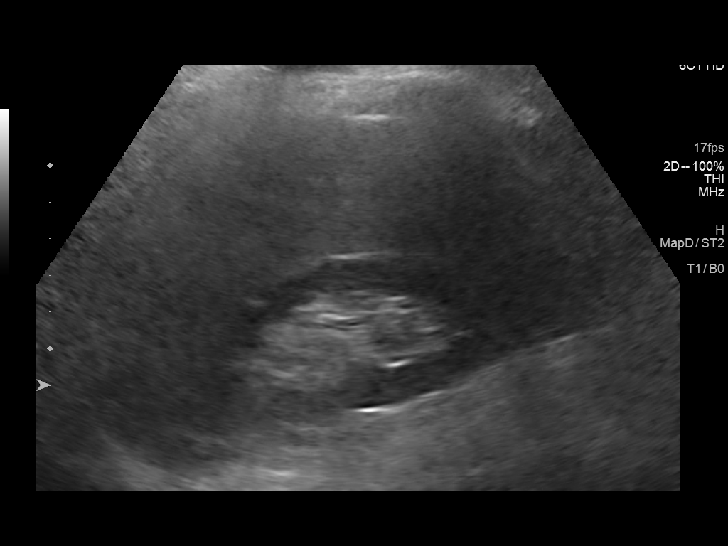
[im 12/31]
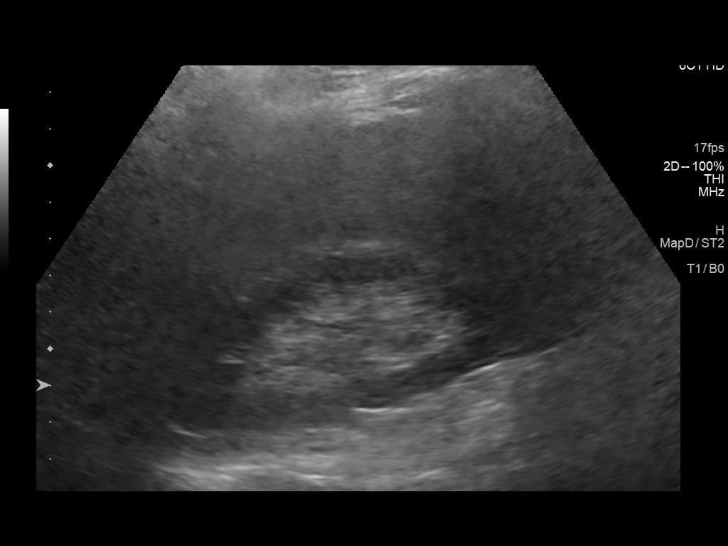
[im 14/31]
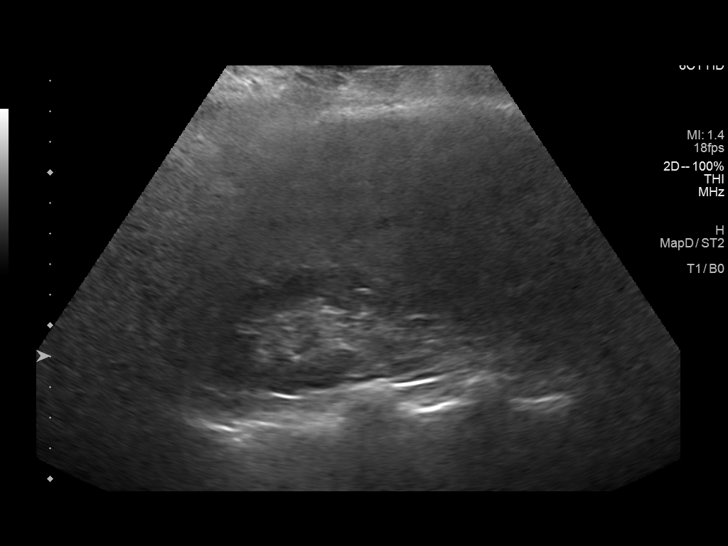
[im 17/31]
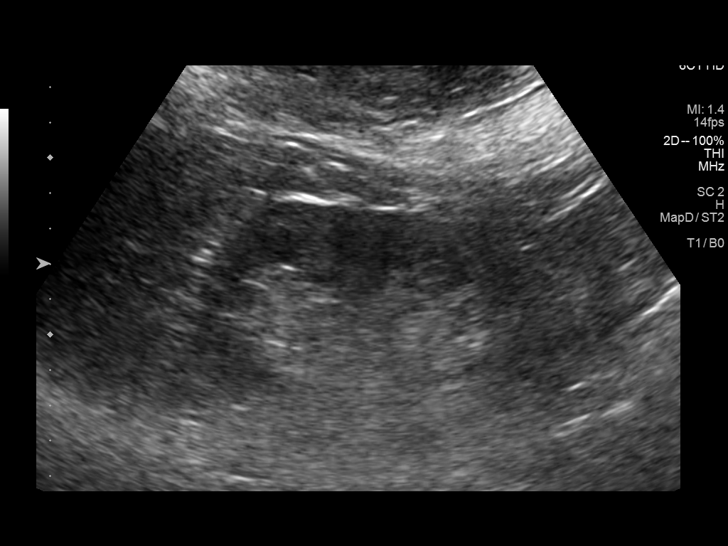
[im 19/31]
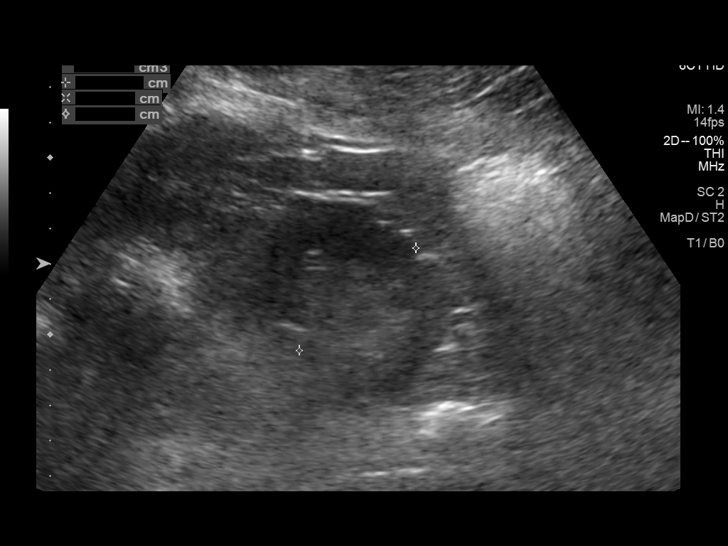
[im 21/31]
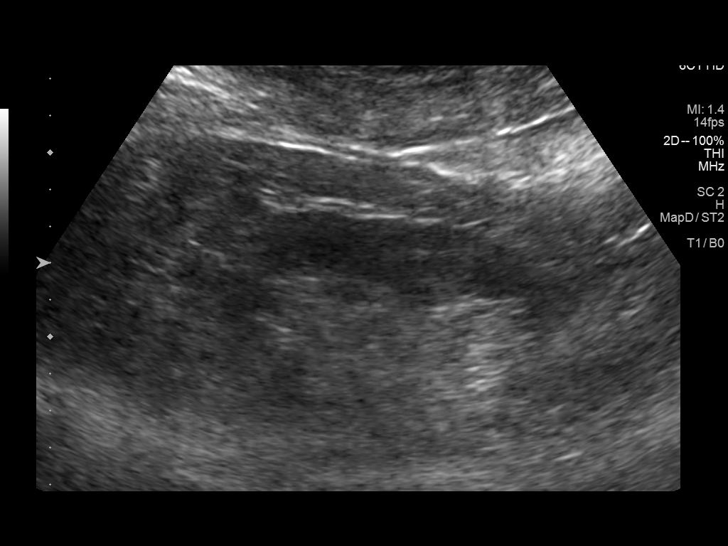
[im 23/31]
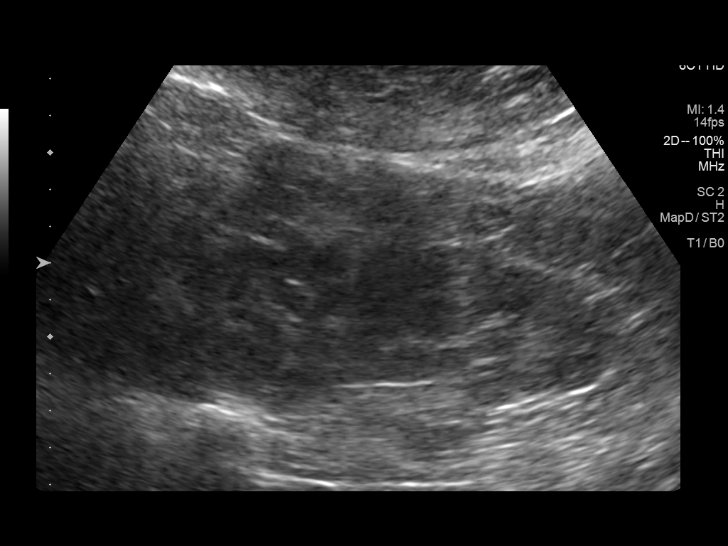
[im 26/31]
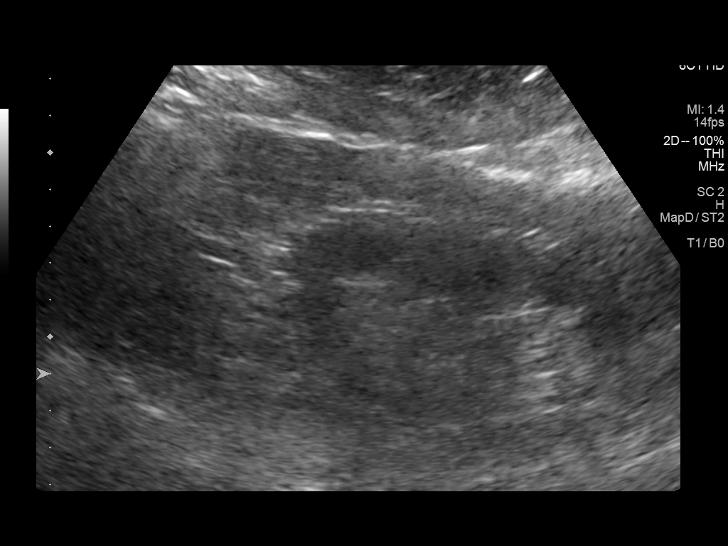
[im 28/31]
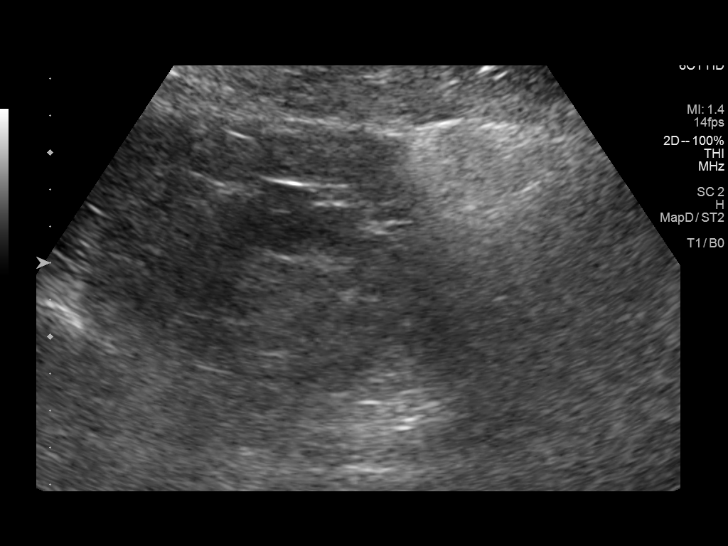
[im 31/31]
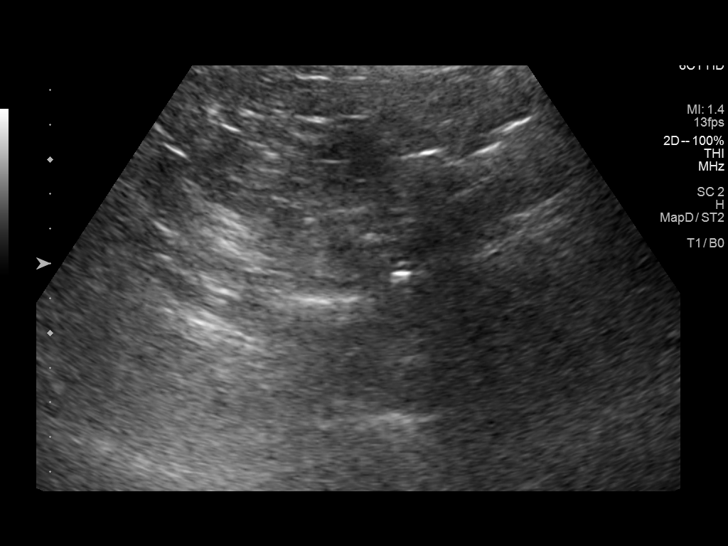

[14 of 25 positions shown; findings below may reference images not displayed]

FINDINGS: Right Kidney:

Renal measurements: 10.6 x 4.5 x 5.1 cm = volume: 124.0 mL. Mild
diffuse cortical thinning. Renal echogenicity within normal limits.
No nephrolithiasis or hydronephrosis. No focal renal mass.

Left Kidney:

Renal measurements: 11.1 x 5.1 x 4.4 cm = volume: 129.0 mL. Renal
echogenicity within normal limits. No nephrolithiasis or
hydronephrosis. No focal renal mass.

Bladder:

Decompressed and not well assessed.

Other:

None.
IMPRESSION: 1. Mild diffuse cortical thinning about the right kidney.
2. Otherwise normal renal ultrasound. Renal echogenicity within
normal limits. No nephrolithiasis or hydronephrosis.

## 2021-01-20 NOTE — Therapy (Signed)
[facility] Physical Therapy [address] [place], [place], [address] Phone: [phone]   Fax:  [phone]  Physical Therapy Treatment  Patient Details  Name: Rachel Ayers MRN: [id number] Date of Birth: [birth year] Referring Provider (PT): Jari Sportsman PA-C   Encounter Date: 01/20/2021   PT End of Session - 01/20/21 1112     Visit Number 4    Number of Visits 12    Date for PT Re-Evaluation 02/12/21    Authorization Type humana 12/28/2020 - 02/28/2021 12 visits    Progress Note Due on Visit 10    PT Start Time 1020    PT Stop Time 1105    PT Time Calculation (min) 45 min    Activity Tolerance Patient limited by pain    Behavior During Therapy [facility] for tasks assessed/performed             Past Medical History:  Diagnosis Date   CAD (coronary artery disease)    Depression    DM (diabetes mellitus) (HCC)    Estrogen deficiency    HTN (hypertension)    Hypercholesteremia    MI (myocardial infarction) (HCC)    Morbid obesity (HCC)    OSA (obstructive sleep apnea)     History reviewed. No pertinent surgical history.  There were no vitals filed for this visit.   Subjective Assessment - 01/20/21 1111     Subjective Pt reporting 3/10 pain today in her neck. Pt stated that she was having some mild sorenes in her left shoulder, but it was nothging like it was initially and it isn't constant.    Pertinent History CAD depression DM HTN MI, hypercholesteremia, obesity OSA    Limitations Lifting;House hold activities    Diagnostic tests MRI on 12/28/2020 :complete tear of the long head of biceps from superior labrum.  intact rotator cuff with tendionpathy, calcification in leading edge of supraspinatus noted.    Patient Stated Goals stop hurting    Currently in Pain? Yes    Pain Score 3     Pain Location Neck    Pain Orientation Lower    Pain Descriptors / Indicators Aching;Sore    Pain Type Acute pain    Pain Onset More than a month ago     Pain Frequency Constant                               [facility] Adult PT Treatment/Exercise - 01/20/21 0001       Exercises   Exercises Neck      Neck Exercises: Machines for Strengthening   UBE (Upper Arm Bike) L2 x 8 minutes      Neck Exercises: Theraband   Rows 10 reps    Rows Limitations 2 sets      Neck Exercises: Standing   Neck Retraction 10 reps;5 secs    Upper Extremity D1 Flexion;Theraband;5 reps    Theraband Level (UE D1) Level 2 (Red)    Upper Extremity D2 Flexion;5 reps;Theraband    Theraband Level (UE D2) Level 2 (Red)      Neck Exercises: Supine   Other Supine Exercise AAROM: shoulder flexion with 1# bar 2 x10, ER 2x10,      Neck Exercises: Stretches   Other Neck Stretches cervical rotation x 3 holding 10 seconds      Modalities   Modalities Moist Heat      Moist Heat Therapy   Number Minutes Moist Heat 8 Minutes  Moist Heat Location Cervical      Manual Therapy   Manual therapy comments IASTM to upper trap, levator and cervical paraspinals, cervical grade 2-3 mobs C3-C7 in supine, occipital release   20 minutes                     PT Short Term Goals - 01/20/21 1113       PT SHORT TERM GOAL #1   Title Pt will be independent in her HEP.    Status On-going               PT Long Term Goals - 01/20/21 1113       PT LONG TERM GOAL #1   Title Pt will be independent in her HEP and progression.    Status On-going      PT LONG TERM GOAL #2   Title Pt will improve her cervical rotation AROM to >/= 50 degrees bilaterally.    Status On-going      PT LONG TERM GOAL #3   Title Pt will improve her FOTO score to >/= 59%.    Status On-going      PT LONG TERM GOAL #4   Title Pt will report pain </= 2/10 with ADL's in her cervical spine.    Status On-going                   Plan - 01/20/21 1116     Clinical Impression Statement Pt arriving today in 3/10 pain in her neck. Pt stating she cancelled her  scheduled MRI of her shoulder due to decreased pain. Pt reporting compliance in her HEP and was asking about line dancing classes at the senior center for exercise/endurance. Pt with decreased tolerance today to percussor. IASTM, cervical mobs and occipital release performed with some relief reported at end of session. Pt stating that due to co-pay she may need to decrease her visits to 1x/ week. Continue skilled PT as pt tolerates to maximize function.    Personal Factors and Comorbidities Comorbidity 3+    Comorbidities CAD depression DM HTN MI, hypercholesteremia, obesity OSA    Examination-Activity Limitations Lift;Sleep;Reach Overhead;Carry    Examination-Participation Restrictions Community Activity;Shop;Other;Meal Prep;Laundry    Stability/Clinical Decision Making Evolving/Moderate complexity    Rehab Potential Fair    PT Frequency 2x / week    PT Duration 6 weeks    PT Treatment/Interventions ADLs/Self Care Home Management;Cryotherapy;Electrical Stimulation;Moist Heat;Traction;Ultrasound;Gait training;Stair training;Functional mobility training;Therapeutic activities;Therapeutic exercise;Balance training;Neuromuscular re-education;Patient/family education;Passive range of motion;Taping;Manual techniques;Dry needling;Spinal Manipulations    PT Next Visit Plan manual threapy IASTM, cervical mobs, postural exercises, shoulder ROM, cervical stretching/strengthening    PT Home Exercise Plan Access Code: [email or web address]  URL: [email or web address]  Date: 12/29/2020  Prepared by: Narda Amber    Exercises  Supine Cervical Retraction with Towel - 2-3 x daily - 7 x weekly - 10 reps - 5 seconds hold  Seated Assisted Cervical Rotation with Towel - 2-3 x daily - 7 x weekly - 10 reps - 10 seconds hold  Seated Cervical Sidebending AROM - 2-3 x daily - 7 x weekly - 10 reps - 10 seconds hold  Supine Shoulder Flexion Extension AAROM with Dowel - 2-3 x daily - 7 x weekly - 2 sets - 10 reps - 5 seconds  hold  Seated Scapular Retraction - 2-3 x daily - 7 x weekly - 2 sets - 10 reps - 5 seconds hold    Consulted and Agree with Plan of  Care Patient             Patient will benefit from skilled therapeutic intervention in order to improve the following deficits and impairments:  Pain, Decreased strength, Increased edema, Postural dysfunction, Impaired UE functional use, Decreased activity tolerance  Visit Diagnosis: Cervicalgia  Muscle weakness (generalized)  Chronic right shoulder pain  Stiffness of right shoulder, not elsewhere classified     Problem List Patient Active Problem List   Diagnosis Date Noted   Obesity, diabetes, and hypertension syndrome (HCC) 06/19/2020   Mixed hyperlipidemia 06/19/2020   Aortic valve sclerosis 06/19/2020   Atrial septal aneurysm 06/19/2020   Strain of calf muscle 04/10/2020   Takotsubo cardiomyopathy 02/26/2020   PVC (premature ventricular contraction) 02/26/2020   PAC (premature atrial contraction) 02/26/2020   Chest pain of uncertain etiology 02/26/2020   Diabetes mellitus with coincident hypertension (HCC) 10/18/2019    Sharmon Leyden, PT, MPT 01/20/2021, 11:21 AM  [facility] Physical Therapy [address] [place], [place], [address] Phone: [phone]   Fax:  [phone]  Name: Rachel Ayers MRN: [id number] Date of Birth: 06-16-1955

## 2021-01-22 ENCOUNTER — Other Ambulatory Visit: Payer: Medicare PPO

## 2021-01-25 ENCOUNTER — Other Ambulatory Visit: Payer: Self-pay

## 2021-01-25 ENCOUNTER — Encounter: Payer: Self-pay | Admitting: Physical Therapy

## 2021-01-25 ENCOUNTER — Ambulatory Visit: Payer: Medicare PPO | Admitting: Physical Therapy

## 2021-01-25 DIAGNOSIS — M25511 Pain in right shoulder: Secondary | ICD-10-CM

## 2021-01-25 DIAGNOSIS — M25611 Stiffness of right shoulder, not elsewhere classified: Secondary | ICD-10-CM | POA: Diagnosis not present

## 2021-01-25 DIAGNOSIS — G8929 Other chronic pain: Secondary | ICD-10-CM | POA: Diagnosis not present

## 2021-01-25 DIAGNOSIS — M6281 Muscle weakness (generalized): Secondary | ICD-10-CM | POA: Diagnosis not present

## 2021-01-25 DIAGNOSIS — M542 Cervicalgia: Secondary | ICD-10-CM | POA: Diagnosis not present

## 2021-01-25 NOTE — Therapy (Addendum)
[facility] [place], [place], [address] Phone: [phone]   Fax:  [phone]  Physical Therapy Treatment Discharge   Patient Details  Name: Rachel Ayers MRN: [id number] Date of Birth: May 05, 1956 Referring Provider (PT): Dwana Melena PA-C   Encounter Date: 01/25/2021   PT End of Session - 01/25/21 1304     Visit Number 5    Number of Visits 12    Date for PT Re-Evaluation 02/12/21    Authorization Type humana 12/28/2020 - 02/28/2021 12 visits    Progress Note Due on Visit 10    PT Start Time 1300    PT Stop Time 1346    PT Time Calculation (min) 46 min    Activity Tolerance Patient limited by pain    Behavior During Therapy [facility] for tasks assessed/performed             Past Medical History:  Diagnosis Date   CAD (coronary artery disease)    Depression    DM (diabetes mellitus) ([place])    Estrogen deficiency    HTN (hypertension)    Hypercholesteremia    MI (myocardial infarction) ([place])    Morbid obesity ([place])    OSA (obstructive sleep apnea)     History reviewed. No pertinent surgical history.  There were no vitals filed for this visit.   Subjective Assessment - 01/25/21 1258     Subjective Pt arriving today reporting 2/10 pain in left shoulder. Pt reporting compliance in her HEP. Pt feels like today can be her last visit.    Pertinent History CAD depression DM HTN MI, hypercholesteremia, obesity OSA    Limitations Lifting;House hold activities    Diagnostic tests MRI on 12/28/2020 :complete tear of the long head of biceps from superior labrum.  intact rotator cuff with tendionpathy, calcification in leading edge of supraspinatus noted.    Patient Stated Goals stop hurting    Currently in Pain? Yes    Pain Score 2     Pain Location Shoulder    Pain Orientation Right    Pain Descriptors / Indicators Sore    Pain Onset More than a month ago                [facility] PT Assessment - 01/25/21 0001        Assessment   Medical Diagnosis M54.2 neck pain, chronic right shoulder pain    Referring Provider (PT) Dwana Melena PA-C      Observation/Other Assessments   Focus on Therapeutic Outcomes (FOTO)  59 (predicted 59)      AROM   Overall AROM Comments measurements in supine for soulder and sitting for cervical    AROM Assessment Site Shoulder    Right/Left Shoulder Right    Right Shoulder Extension 40 Degrees    Right Shoulder Flexion 132 Degrees    Right Shoulder ABduction 125 Degrees    Right Shoulder Internal Rotation 65 Degrees   shoulder abd 45 degrees   Right Shoulder External Rotation 70 Degrees   shoulder abd 5 degrees   Cervical Flexion 40    Cervical Extension 20    Cervical - Right Side Bend 20    Cervical - Left Side Bend 18    Cervical - Right Rotation 55    Cervical - Left Rotation 56      Strength   Overall Strength Comments right shoulder strength: grossly 4/5      Transfers   Five time sit to stand comments  13  seconds with no UE support                           [facility] Adult PT Treatment/Exercise - 01/25/21 0001       Exercises   Exercises Neck      Neck Exercises: Machines for Strengthening   UBE (Upper Arm Bike) L2 x 8 minutes      Neck Exercises: Theraband   Rows 10 reps    Rows Limitations 2 sets BATCA x 15 #      Neck Exercises: Standing   Neck Retraction 10 reps;5 secs    Other Standing Exercises cervical rotation x 5 each side      Neck Exercises: Supine   Other Supine Exercise AAROM: shoulder flexion with 1# bar 2 x10, ER 2x10,      Modalities   Modalities Moist Heat      Moist Heat Therapy   Number Minutes Moist Heat 5 Minutes    Moist Heat Location Cervical      Manual Therapy   Manual therapy comments percussion to right upper trap and levator                      PT Short Term Goals - 01/25/21 1306       PT SHORT TERM GOAL #1   Title Pt will be independent in her HEP.    Status Achieved                PT Long Term Goals - 01/25/21 1307       PT LONG TERM GOAL #1   Title Pt will be independent in her HEP and progression.    Status On-going      PT LONG TERM GOAL #2   Title Pt will improve her cervical rotation AROM to >/= 50 degrees bilaterally.    Baseline 55 degrees to right and 56 degrees to the left.    Status Achieved      PT LONG TERM GOAL #3   Title Pt will improve her FOTO score to >/= 59%.    Baseline 59% on 01/25/2021    Status Achieved      PT LONG TERM GOAL #4   Title Pt will report pain </= 2/10 with ADL's in her cervical spine.    Baseline 3/10 on average with ADL's    Status On-going                   Plan - 01/25/21 1342     Clinical Impression Statement Pt arriving today reporting 2/10 pain in her right shoulder. Pt feels like she is making progress and would like to end her PT sessions if her pain contiues to stay low. We have dropped her down to 1x/ week with possible discharge next visit.    Comorbidities CAD depression DM HTN MI, hypercholesteremia, obesity OSA    Examination-Activity Limitations Lift;Sleep;Reach Overhead;Carry    Examination-Participation Restrictions Community Activity;Shop;Other;Meal Prep;Laundry    Stability/Clinical Decision Making Evolving/Moderate complexity    Rehab Potential Fair    PT Frequency 2x / week    PT Duration 6 weeks    PT Treatment/Interventions ADLs/Self Care Home Management;Cryotherapy;Electrical Stimulation;Moist Heat;Traction;Ultrasound;Gait training;Stair training;Functional mobility training;Therapeutic activities;Therapeutic exercise;Balance training;Neuromuscular re-education;Patient/family education;Passive range of motion;Taping;Manual techniques;Dry needling;Spinal Manipulations    PT Next Visit Plan discharge questionable next visit.  postural exercises, shoulder ROM, cervical stretching/strengthening    PT Home Exercise Plan Access Code:  [email or web address]  URL:  [email or web address]  Date: 12/29/2020  Prepared by: Kearney Hard    Exercises  Supine Cervical Retraction with Towel - 2-3 x daily - 7 x weekly - 10 reps - 5 seconds hold  Seated Assisted Cervical Rotation with Towel - 2-3 x daily - 7 x weekly - 10 reps - 10 seconds hold  Seated Cervical Sidebending AROM - 2-3 x daily - 7 x weekly - 10 reps - 10 seconds hold  Supine Shoulder Flexion Extension AAROM with Dowel - 2-3 x daily - 7 x weekly - 2 sets - 10 reps - 5 seconds hold  Seated Scapular Retraction - 2-3 x daily - 7 x weekly - 2 sets - 10 reps - 5 seconds hold    Consulted and Agree with Plan of Care Patient             Patient will benefit from skilled therapeutic intervention in order to improve the following deficits and impairments:  Pain, Decreased strength, Increased edema, Postural dysfunction, Impaired UE functional use, Decreased activity tolerance  Visit Diagnosis: Cervicalgia  Muscle weakness (generalized)  Chronic right shoulder pain  Stiffness of right shoulder, not elsewhere classified  PHYSICAL THERAPY DISCHARGE SUMMARY  Visits from Start of Care: 5  Current functional level related to goals / functional outcomes: See above   Remaining deficits: See above   Education / Equipment: HEP   Patient agrees to discharge. Patient goals were partially met. Patient is being discharged due to not returning since the last visit.    Problem List Patient Active Problem List   Diagnosis Date Noted   Obesity, diabetes, and hypertension syndrome ([place]) 06/19/2020   Mixed hyperlipidemia 06/19/2020   Aortic valve sclerosis 06/19/2020   Atrial septal aneurysm 06/19/2020   Strain of calf muscle 04/10/2020   Takotsubo cardiomyopathy 02/26/2020   PVC (premature ventricular contraction) 02/26/2020   PAC (premature atrial contraction) 02/26/2020   Chest pain of uncertain etiology 96/22/2979   Diabetes mellitus with coincident hypertension ([place]) 10/18/2019     Oretha Caprice, PT, MPT 01/25/2021, 1:51 PM  [facility] Physical Therapy [address] [place], [place], [address] Phone: [phone]   Fax:  [phone]  Name: Rachel Ayers MRN: [id number] Date of Birth: 12/08/1955

## 2021-01-27 ENCOUNTER — Encounter: Payer: Medicare PPO | Admitting: Physical Therapy

## 2021-01-27 DIAGNOSIS — Z85828 Personal history of other malignant neoplasm of skin: Secondary | ICD-10-CM | POA: Diagnosis not present

## 2021-01-27 DIAGNOSIS — Z08 Encounter for follow-up examination after completed treatment for malignant neoplasm: Secondary | ICD-10-CM | POA: Diagnosis not present

## 2021-01-27 DIAGNOSIS — Z1283 Encounter for screening for malignant neoplasm of skin: Secondary | ICD-10-CM | POA: Diagnosis not present

## 2021-01-27 DIAGNOSIS — L570 Actinic keratosis: Secondary | ICD-10-CM | POA: Diagnosis not present

## 2021-01-27 DIAGNOSIS — L280 Lichen simplex chronicus: Secondary | ICD-10-CM | POA: Diagnosis not present

## 2021-01-27 DIAGNOSIS — X32XXXD Exposure to sunlight, subsequent encounter: Secondary | ICD-10-CM | POA: Diagnosis not present

## 2021-02-01 ENCOUNTER — Encounter: Payer: Medicare PPO | Admitting: Physical Therapy

## 2021-02-01 DIAGNOSIS — N1831 Chronic kidney disease, stage 3a: Secondary | ICD-10-CM | POA: Diagnosis not present

## 2021-02-01 DIAGNOSIS — E038 Other specified hypothyroidism: Secondary | ICD-10-CM | POA: Diagnosis not present

## 2021-02-01 DIAGNOSIS — E1169 Type 2 diabetes mellitus with other specified complication: Secondary | ICD-10-CM | POA: Diagnosis not present

## 2021-02-01 DIAGNOSIS — F321 Major depressive disorder, single episode, moderate: Secondary | ICD-10-CM | POA: Diagnosis not present

## 2021-02-01 DIAGNOSIS — E7800 Pure hypercholesterolemia, unspecified: Secondary | ICD-10-CM | POA: Diagnosis not present

## 2021-02-01 DIAGNOSIS — Z23 Encounter for immunization: Secondary | ICD-10-CM | POA: Diagnosis not present

## 2021-02-01 DIAGNOSIS — I25118 Atherosclerotic heart disease of native coronary artery with other forms of angina pectoris: Secondary | ICD-10-CM | POA: Diagnosis not present

## 2021-02-01 DIAGNOSIS — Z0001 Encounter for general adult medical examination with abnormal findings: Secondary | ICD-10-CM | POA: Diagnosis not present

## 2021-02-01 DIAGNOSIS — I10 Essential (primary) hypertension: Secondary | ICD-10-CM | POA: Diagnosis not present

## 2021-02-03 ENCOUNTER — Encounter: Payer: Medicare PPO | Admitting: Physical Therapy

## 2021-02-03 DIAGNOSIS — G4733 Obstructive sleep apnea (adult) (pediatric): Secondary | ICD-10-CM | POA: Diagnosis not present

## 2021-02-10 DIAGNOSIS — N1831 Chronic kidney disease, stage 3a: Secondary | ICD-10-CM | POA: Diagnosis not present

## 2021-02-11 DIAGNOSIS — K219 Gastro-esophageal reflux disease without esophagitis: Secondary | ICD-10-CM | POA: Diagnosis not present

## 2021-02-11 DIAGNOSIS — E119 Type 2 diabetes mellitus without complications: Secondary | ICD-10-CM | POA: Diagnosis not present

## 2021-02-11 DIAGNOSIS — K9509 Other complications of gastric band procedure: Secondary | ICD-10-CM | POA: Diagnosis not present

## 2021-02-11 DIAGNOSIS — I10 Essential (primary) hypertension: Secondary | ICD-10-CM | POA: Diagnosis not present

## 2021-02-17 DIAGNOSIS — Z0289 Encounter for other administrative examinations: Secondary | ICD-10-CM

## 2021-02-19 ENCOUNTER — Other Ambulatory Visit: Payer: Self-pay

## 2021-02-19 ENCOUNTER — Ambulatory Visit (INDEPENDENT_AMBULATORY_CARE_PROVIDER_SITE_OTHER): Payer: Medicare PPO | Admitting: Bariatrics

## 2021-02-19 ENCOUNTER — Encounter (INDEPENDENT_AMBULATORY_CARE_PROVIDER_SITE_OTHER): Payer: Self-pay | Admitting: Bariatrics

## 2021-02-19 VITALS — BP 121/75 | HR 62 | Temp 98.0°F | Ht 64.0 in | Wt 192.0 lb

## 2021-02-19 DIAGNOSIS — I152 Hypertension secondary to endocrine disorders: Secondary | ICD-10-CM

## 2021-02-19 DIAGNOSIS — R0602 Shortness of breath: Secondary | ICD-10-CM | POA: Diagnosis not present

## 2021-02-19 DIAGNOSIS — G4739 Other sleep apnea: Secondary | ICD-10-CM | POA: Diagnosis not present

## 2021-02-19 DIAGNOSIS — G4733 Obstructive sleep apnea (adult) (pediatric): Secondary | ICD-10-CM | POA: Diagnosis not present

## 2021-02-19 DIAGNOSIS — E559 Vitamin D deficiency, unspecified: Secondary | ICD-10-CM

## 2021-02-19 DIAGNOSIS — Z1331 Encounter for screening for depression: Secondary | ICD-10-CM | POA: Diagnosis not present

## 2021-02-19 DIAGNOSIS — E6609 Other obesity due to excess calories: Secondary | ICD-10-CM

## 2021-02-19 DIAGNOSIS — Z6833 Body mass index (BMI) 33.0-33.9, adult: Secondary | ICD-10-CM | POA: Diagnosis not present

## 2021-02-19 DIAGNOSIS — E669 Obesity, unspecified: Secondary | ICD-10-CM

## 2021-02-19 DIAGNOSIS — Z9884 Bariatric surgery status: Secondary | ICD-10-CM

## 2021-02-19 DIAGNOSIS — R5383 Other fatigue: Secondary | ICD-10-CM | POA: Diagnosis not present

## 2021-02-19 DIAGNOSIS — E1169 Type 2 diabetes mellitus with other specified complication: Secondary | ICD-10-CM | POA: Diagnosis not present

## 2021-02-19 DIAGNOSIS — E782 Mixed hyperlipidemia: Secondary | ICD-10-CM | POA: Diagnosis not present

## 2021-02-19 DIAGNOSIS — E1159 Type 2 diabetes mellitus with other circulatory complications: Secondary | ICD-10-CM

## 2021-02-19 DIAGNOSIS — Z6832 Body mass index (BMI) 32.0-32.9, adult: Secondary | ICD-10-CM

## 2021-02-19 NOTE — Progress Notes (Signed)
Chief Complaint:   OBESITY Rachel Ayers (MR# [id number]) is a 65 y.o. female who presents for evaluation and treatment of obesity and related comorbidities. Current BMI is Body mass index is 32.96 kg/m. Rachel Ayers has been struggling with her weight for many years and has been unsuccessful in either losing weight, maintaining weight loss, or reaching her healthy weight goal.  Rachel Ayers is currently in the action stage of change and ready to dedicate time achieving and maintaining a healthier weight. Rachel Ayers is interested in becoming our patient and working on intensive lifestyle modifications including (but not limited to) diet and exercise for weight loss.  Rachel Ayers does not enjoy cooking. She craves sweets. She states that she is good water drinker.  Rachel Ayers's habits were reviewed today and are as follows: her desired weight loss is 42 lbs, she has been heavy most of her life, she started gaining weight as a teenager, her heaviest weight ever was 231 pounds, she has significant food cravings issues, she snacks frequently in the evenings, she skips meals frequently, she is frequently drinking liquids with calories, she frequently makes poor food choices, she has binge eating behaviors, and she struggles with emotional eating.  Depression Screen Rachel Ayers's Food and Mood (modified PHQ-9) score was 11.  Depression screen PHQ 2/9 02/19/2021  Decreased Interest 2  Down, Depressed, Hopeless 1  PHQ - 2 Score 3  Altered sleeping 1  Tired, decreased energy 1  Change in appetite 2  Feeling bad or failure about yourself  3  Trouble concentrating 1  Moving slowly or fidgety/restless 0  Suicidal thoughts 0  PHQ-9 Score 11  Difficult doing work/chores Very difficult   Subjective:   1. Other fatigue Rachel Ayers admits to daytime somnolence and denies waking up still tired. Patent has a history of symptoms of daytime fatigue. Rachel Ayers generally gets 7 or 8 hours of sleep per night, and states that she  has poor sleep quality. Snoring is present. Apneic episodes are present. Epworth Sleepiness Score is 9.  2. SOB (shortness of breath) on exertion Rachel Ayers notes increasing shortness of breath with exercising and seems to be worsening over time with weight gain. She notes getting out of breath sooner with activity than she used to. This has gotten worse recently. Rachel Ayers denies shortness of breath at rest or orthopnea.  3. Mixed hyperlipidemia Rachel Ayers is taking simvastatin.  4. Diabetes mellitus type 2 in obese (HCC) Rachel Ayers is taking Ozempic (for 5 weeks) and notes decreased appetite and cravings.  5. Hypertension associated with diabetes (HCC) Rachel Ayers is taking olmesartan.  6. Other sleep apnea Mild sleep apnea. Rachel Ayers has tried to wear mask but it does not work for her.  7. Hx of laparoscopic gastric banding, 2010 History of lap band placement.  8. Vitamin D deficiency She is taking Vit D supplementation.  Assessment/Plan:   1. Other fatigue Rachel Ayers does feel that her weight is causing her energy to be lower than it should be. Fatigue may be related to obesity, depression or many other causes. Labs will be ordered, and in the meanwhile, Rachel Ayers will focus on self care including making healthy food choices, increasing physical activity and focusing on stress reduction. Check labs today.  - EKG 12-Lead - T3 - T4, free - TSH  2. SOB (shortness of breath) on exertion Rachel Ayers does feel that she gets out of breath more easily that she used to when she exercises. Rachel Ayers's shortness of breath appears to be obesity related and exercise  induced. She has agreed to work on weight loss and gradually increase exercise to treat her exercise induced shortness of breath. Will continue to monitor closely.  3. Mixed hyperlipidemia Cardiovascular risk and specific lipid/LDL goals reviewed.  We discussed several lifestyle modifications today and Rachel Ayers will continue to work on diet, exercise and weight loss  efforts. Orders and follow up as documented in patient record.   Counseling Intensive lifestyle modifications are the first line treatment for this issue. Dietary changes: Increase soluble fiber. Decrease simple carbohydrates. Exercise changes: Moderate to vigorous-intensity aerobic activity 150 minutes per week if tolerated. Lipid-lowering medications: see documented in medical record. Check labs today.  - Lipid Panel With LDL/HDL Ratio  4. Diabetes mellitus type 2 in obese (HCC) Good blood sugar control is important to decrease the likelihood of diabetic complications such as nephropathy, neuropathy, limb loss, blindness, coronary artery disease, and death. Intensive lifestyle modification including diet, exercise and weight loss are the first line of treatment for diabetes.  Check labs today.  - Hemoglobin A1c - Insulin, random  5. Hypertension associated with diabetes (HCC) Rachel Ayers is working on healthy weight loss and exercise to improve blood pressure control. We will watch for signs of hypotension as she continues her lifestyle modifications. Check labs today.  - Comprehensive metabolic panel  6. Other sleep apnea Discussed the importance of restful sleep. Intensive lifestyle modifications are the first line treatment for this issue. We discussed several lifestyle modifications today and she will continue to work on diet, exercise and weight loss efforts. We will continue to monitor. Orders and follow up as documented in patient record.   7. Hx of laparoscopic gastric banding, 2010 Rachel Ayers will have lap band removed November 2022.  8. Vitamin D deficiency Low Vitamin D level contributes to fatigue and are associated with obesity, breast, and colon cancer. She agrees to follow-up for routine testing of Vitamin D, at least 2-3 times per year to avoid over-replacement. Check labs today.  - VITAMIN D 25 Hydroxy (Vit-D Deficiency, Fractures)  9. Depression screen Rachel Ayers had a  positive depression screening. Depression is commonly associated with obesity and often results in emotional eating behaviors. We will monitor this closely and work on CBT to help improve the non-hunger eating patterns. Referral to Psychology may be required if no improvement is seen as she continues in our clinic.  10. Class 1 obesity with serious comorbidity and body mass index (BMI) of 33.0 to 33.9 in adult, unspecified obesity type  Rachel Ayers is currently in the action stage of change and her goal is to continue with weight loss efforts. I recommend Rachel Ayers begin the structured treatment plan as follows:  She has agreed to the Category 1 Plan- protein shake at lunch + 200 calories.  Exercise goals:  Rachel Ayers will begin exercise.    Behavioral modification strategies: increasing lean protein intake, decreasing simple carbohydrates, increasing vegetables, increasing water intake, decreasing eating out, no skipping meals, meal planning and cooking strategies, keeping healthy foods in the home, and planning for success.  She was informed of the importance of frequent follow-up visits to maximize her success with intensive lifestyle modifications for her multiple health conditions. She was informed we would discuss her lab results at her next visit unless there is a critical issue that needs to be addressed sooner. Rachel Ayers agreed to keep her next visit at the agreed upon time to discuss these results.  Objective:   Blood pressure 121/75, pulse 62, temperature 98 F (36.7 C), height 5'  4" (1.626 m), weight 192 lb (87.1 kg), SpO2 95 %. Body mass index is 32.96 kg/m.  EKG: Normal sinus rhythm, rate 71.  Indirect Calorimeter completed today shows a VO2 of 165 and a REE of 1138.  Her calculated basal metabolic rate is 8469 thus her basal metabolic rate is worse than expected.  General: Cooperative, alert, well developed, in no acute distress. HEENT: Conjunctivae and lids unremarkable. Cardiovascular:  Regular rhythm.  Lungs: Normal work of breathing. Neurologic: No focal deficits.   Lab Results  Component Value Date   CREATININE 1.21 (H) 05/05/2020   BUN 22 05/05/2020   NA 138 05/05/2020   K 4.1 05/05/2020   CL 101 05/05/2020   CO2 29 05/05/2020   No results found for: ALT, AST, GGT, ALKPHOS, BILITOT Lab Results  Component Value Date   HGBA1C 6.1 (A) 10/15/2019   No results found for: INSULIN No results found for: TSH No results found for: CHOL, HDL, LDLCALC, LDLDIRECT, TRIG, CHOLHDL Lab Results  Component Value Date   WBC 6.8 05/05/2020   HGB 12.5 05/05/2020   HCT 37.6 05/05/2020   MCV 94.0 05/05/2020   PLT 239 05/05/2020   No results found for: IRON, TIBC, FERRITIN Obesity Behavioral Intervention:   Approximately 15 minutes were spent on the discussion below.  ASK: We discussed the diagnosis of obesity with Rachel Ayers today and Rachel Ayers agreed to give [place] permission to discuss obesity behavioral modification therapy today.  ASSESS: Rachel Ayers has the diagnosis of obesity and her BMI today is 33.1. Rachel Ayers is in the action stage of change.   ADVISE: Rachel Ayers was educated on the multiple health risks of obesity as well as the benefit of weight loss to improve her health. She was advised of the need for long term treatment and the importance of lifestyle modifications to improve her current health and to decrease her risk of future health problems.  AGREE: Multiple dietary modification options and treatment options were discussed and Rachel Ayers agreed to follow the recommendations documented in the above note.  ARRANGE: Rachel Ayers was educated on the importance of frequent visits to treat obesity as outlined per CMS and USPSTF guidelines and agreed to schedule her next follow up appointment today.  Attestation Statements:   Reviewed by clinician on day of visit: allergies, medications, problem list, medical history, surgical history, family history, social history, and previous  encounter notes.  Edmund Hilda, CMA, am acting as Energy manager for [employer], DO.  I have reviewed the above documentation for accuracy and completeness, and I agree with the above. Corinna Capra, DO

## 2021-02-22 ENCOUNTER — Encounter (INDEPENDENT_AMBULATORY_CARE_PROVIDER_SITE_OTHER): Payer: Self-pay | Admitting: Bariatrics

## 2021-02-22 DIAGNOSIS — E6609 Other obesity due to excess calories: Secondary | ICD-10-CM | POA: Insufficient documentation

## 2021-02-22 DIAGNOSIS — E1169 Type 2 diabetes mellitus with other specified complication: Secondary | ICD-10-CM | POA: Diagnosis not present

## 2021-02-22 DIAGNOSIS — I152 Hypertension secondary to endocrine disorders: Secondary | ICD-10-CM | POA: Diagnosis not present

## 2021-02-22 DIAGNOSIS — R0602 Shortness of breath: Secondary | ICD-10-CM | POA: Diagnosis not present

## 2021-02-22 DIAGNOSIS — E782 Mixed hyperlipidemia: Secondary | ICD-10-CM | POA: Diagnosis not present

## 2021-02-22 DIAGNOSIS — E1159 Type 2 diabetes mellitus with other circulatory complications: Secondary | ICD-10-CM | POA: Diagnosis not present

## 2021-02-22 DIAGNOSIS — E66811 Obesity, class 1: Secondary | ICD-10-CM | POA: Insufficient documentation

## 2021-02-22 DIAGNOSIS — R5383 Other fatigue: Secondary | ICD-10-CM | POA: Diagnosis not present

## 2021-02-22 DIAGNOSIS — E669 Obesity, unspecified: Secondary | ICD-10-CM | POA: Diagnosis not present

## 2021-02-22 DIAGNOSIS — Z6832 Body mass index (BMI) 32.0-32.9, adult: Secondary | ICD-10-CM | POA: Insufficient documentation

## 2021-03-04 ENCOUNTER — Encounter (INDEPENDENT_AMBULATORY_CARE_PROVIDER_SITE_OTHER): Payer: Self-pay | Admitting: Bariatrics

## 2021-03-04 DIAGNOSIS — E1169 Type 2 diabetes mellitus with other specified complication: Secondary | ICD-10-CM | POA: Insufficient documentation

## 2021-03-04 LAB — COMPREHENSIVE METABOLIC PANEL
ALT: 26 IU/L (ref 0–32)
AST: 22 IU/L (ref 0–40)
Albumin/Globulin Ratio: 2 (ref 1.2–2.2)
Albumin: 4.5 g/dL (ref 3.8–4.8)
Alkaline Phosphatase: 59 IU/L (ref 44–121)
BUN/Creatinine Ratio: 14 (ref 12–28)
BUN: 16 mg/dL (ref 8–27)
Bilirubin Total: 0.3 mg/dL (ref 0.0–1.2)
CO2: 25 mmol/L (ref 20–29)
Calcium: 9.9 mg/dL (ref 8.7–10.3)
Chloride: 100 mmol/L (ref 96–106)
Creatinine, Ser: 1.14 mg/dL — ABNORMAL HIGH (ref 0.57–1.00)
Globulin, Total: 2.3 g/dL (ref 1.5–4.5)
Glucose: 103 mg/dL — ABNORMAL HIGH (ref 65–99)
Potassium: 5 mmol/L (ref 3.5–5.2)
Sodium: 140 mmol/L (ref 134–144)
Total Protein: 6.8 g/dL (ref 6.0–8.5)
eGFR: 53 mL/min/{1.73_m2} — ABNORMAL LOW (ref >59)

## 2021-03-04 LAB — LIPID PANEL WITH LDL/HDL RATIO
Cholesterol, Total: 184 mg/dL (ref 100–199)
HDL: 50 mg/dL (ref >39)
LDL Chol Calc (NIH): 106 mg/dL — ABNORMAL HIGH (ref 0–99)
LDL/HDL Ratio: 2.1 ratio (ref 0.0–3.2)
Triglycerides: 161 mg/dL — ABNORMAL HIGH (ref 0–149)
VLDL Cholesterol Cal: 28 mg/dL (ref 5–40)

## 2021-03-04 LAB — CBC WITH DIFFERENTIAL/PLATELET
Basophils Absolute: 0.1 10*3/uL (ref 0.0–0.2)
Basos: 1 %
EOS (ABSOLUTE): 0.2 10*3/uL (ref 0.0–0.4)
Eos: 3 %
Hematocrit: 44.1 % (ref 34.0–46.6)
Hemoglobin: 13.3 g/dL (ref 11.1–15.9)
Immature Grans (Abs): 0.1 10*3/uL (ref 0.0–0.1)
Immature Granulocytes: 1 %
Lymphocytes Absolute: 1.6 10*3/uL (ref 0.7–3.1)
Lymphs: 25 %
MCH: 30 pg (ref 26.6–33.0)
MCHC: 30.2 g/dL — ABNORMAL LOW (ref 31.5–35.7)
MCV: 100 fL — ABNORMAL HIGH (ref 79–97)
Monocytes Absolute: 0.2 10*3/uL (ref 0.1–0.9)
Monocytes: 3 %
Neutrophils Absolute: 4.3 10*3/uL (ref 1.4–7.0)
Neutrophils: 67 %
Platelets: 211 10*3/uL (ref 150–450)
RBC: 4.43 x10E6/uL (ref 3.77–5.28)
RDW: 12.5 % (ref 11.7–15.4)
WBC: 6.4 10*3/uL (ref 3.4–10.8)

## 2021-03-04 LAB — INSULIN, RANDOM: INSULIN: 13.3 u[IU]/mL (ref 2.6–24.9)

## 2021-03-04 LAB — TSH: TSH: 2.59 u[IU]/mL (ref 0.450–4.500)

## 2021-03-04 LAB — HEMOGLOBIN A1C
Est. average glucose Bld gHb Est-mCnc: 146 mg/dL
Hgb A1c MFr Bld: 6.7 % — ABNORMAL HIGH (ref 4.8–5.6)

## 2021-03-04 LAB — T4, FREE: Free T4: 1.35 ng/dL (ref 0.82–1.77)

## 2021-03-04 LAB — SPECIMEN STATUS REPORT

## 2021-03-04 LAB — T3: T3, Total: 111 ng/dL (ref 71–180)

## 2021-03-04 LAB — VITAMIN D 25 HYDROXY (VIT D DEFICIENCY, FRACTURES): Vit D, 25-Hydroxy: 39.8 ng/mL (ref 30.0–100.0)

## 2021-03-05 ENCOUNTER — Other Ambulatory Visit: Payer: Self-pay

## 2021-03-05 ENCOUNTER — Encounter (INDEPENDENT_AMBULATORY_CARE_PROVIDER_SITE_OTHER): Payer: Self-pay | Admitting: Bariatrics

## 2021-03-05 ENCOUNTER — Ambulatory Visit (INDEPENDENT_AMBULATORY_CARE_PROVIDER_SITE_OTHER): Payer: Medicare PPO | Admitting: Bariatrics

## 2021-03-05 VITALS — BP 126/74 | HR 65 | Temp 98.0°F | Ht 64.0 in | Wt 191.0 lb

## 2021-03-05 DIAGNOSIS — Z6833 Body mass index (BMI) 33.0-33.9, adult: Secondary | ICD-10-CM

## 2021-03-05 DIAGNOSIS — E1169 Type 2 diabetes mellitus with other specified complication: Secondary | ICD-10-CM | POA: Diagnosis not present

## 2021-03-05 DIAGNOSIS — E669 Obesity, unspecified: Secondary | ICD-10-CM

## 2021-03-05 DIAGNOSIS — E559 Vitamin D deficiency, unspecified: Secondary | ICD-10-CM | POA: Diagnosis not present

## 2021-03-05 MED ORDER — VITAMIN D (ERGOCALCIFEROL) 1.25 MG (50000 UNIT) PO CAPS: 50000.0000 [IU] | ORAL_CAPSULE | ORAL | 0 refills | Status: DC

## 2021-03-08 NOTE — Progress Notes (Signed)
Chief Complaint:   OBESITY Sabiha is here to discuss her progress with her obesity treatment plan along with follow-up of her obesity related diagnoses. Faven is on the Category 1 Plan - protein shake at lunch +200 calories and states she is following her eating plan approximately 85-90% of the time. Mena states she is playing pickle ball for 45 minutes 1  time per week.  Today's visit was #: 2 Starting weight: 192 lbs Starting date: 02/19/2021 Today's weight: 191 lbs Today's date: 03/05/2021 Total lbs lost to date: 1 lb Total lbs lost since last in-office visit: 1 lb  Interim History: Leontyne is down 1 pound.  Subjective:   1. Diabetes mellitus type 2 in obese (HCC) Brynlei is taking Ozempic.  A1c is 6.7.  Lab Results  Component Value Date   HGBA1C 6.7 (H) 02/22/2021   HGBA1C 6.1 (A) 10/15/2019   Lab Results  Component Value Date   LDLCALC 106 (H) 02/22/2021   CREATININE 1.14 (H) 02/22/2021   Lab Results  Component Value Date   INSULIN 13.3 02/22/2021   2. Vitamin D insufficiency She is currently taking no vitamin D supplement.  Vitamin D level 39.8.  Lab Results  Component Value Date   VD25OH 39.8 02/22/2021   Assessment/Plan:   1. Diabetes mellitus type 2 in obese (HCC) Good blood sugar control is important to decrease the likelihood of diabetic complications such as nephropathy, neuropathy, limb loss, blindness, coronary artery disease, and death. Intensive lifestyle modification including diet, exercise and weight loss are the first line of treatment for diabetes. Continue Ozempic.  2. Vitamin D insufficiency Low Vitamin D level contributes to fatigue and are associated with obesity, breast, and colon cancer. She agrees to continue to start to take prescription Vitamin D @50 ,000 IU every week and will follow-up for routine testing of Vitamin D, at least 2-3 times per year to avoid over-replacement.  - Start Vitamin D, Ergocalciferol, (DRISDOL) 1.25 MG  (50000 UNIT) CAPS capsule; Take 1 capsule (50,000 Units total) by mouth every 7 (seven) days.  Dispense: 4 capsule; Refill: 0  3. Obesity with current BMI 32.9  Lakima is currently in the action stage of change. As such, her goal is to continue with weight loss efforts. She has agreed to the Category 1 Plan and keeping a food journal and adhering to recommended goals of 1000 calories and 70-80 grams of protein.   She will work on meal planning and will stay with the plan 80-90%.  We reviewed her labs including CBC, CMP, lipids, A1c, thyroid panel, vitamin D, and insulin.  Exercise goals:  Pickleball, will get in pool.  Behavioral modification strategies: increasing lean protein intake, decreasing simple carbohydrates, increasing vegetables, increasing water intake, decreasing eating out, no skipping meals, meal planning and cooking strategies, keeping healthy foods in the home, and planning for success.  Kasiah has agreed to follow-up with our clinic in 2 weeks. She was informed of the importance of frequent follow-up visits to maximize her success with intensive lifestyle modifications for her multiple health conditions.   Objective:   Blood pressure 126/74, pulse 65, temperature 98 F (36.7 C), height 5\' 4"  (1.626 m), weight 191 lb (86.6 kg), SpO2 96 %. Body mass index is 32.79 kg/m.  General: Cooperative, alert, well developed, in no acute distress. HEENT: Conjunctivae and lids unremarkable. Cardiovascular: Regular rhythm.  Lungs: Normal work of breathing. Neurologic: No focal deficits.   Lab Results  Component Value Date   CREATININE 1.14 (  H) 02/22/2021   BUN 16 02/22/2021   NA 140 02/22/2021   K 5.0 02/22/2021   CL 100 02/22/2021   CO2 25 02/22/2021   Lab Results  Component Value Date   ALT 26 02/22/2021   AST 22 02/22/2021   ALKPHOS 59 02/22/2021   BILITOT 0.3 02/22/2021   Lab Results  Component Value Date   HGBA1C 6.7 (H) 02/22/2021   HGBA1C 6.1 (A) 10/15/2019    Lab Results  Component Value Date   INSULIN 13.3 02/22/2021   Lab Results  Component Value Date   TSH 2.590 02/22/2021   Lab Results  Component Value Date   CHOL 184 02/22/2021   HDL 50 02/22/2021   LDLCALC 106 (H) 02/22/2021   TRIG 161 (H) 02/22/2021   Lab Results  Component Value Date   VD25OH 39.8 02/22/2021   Lab Results  Component Value Date   WBC 6.4 02/22/2021   HGB 13.3 02/22/2021   HCT 44.1 02/22/2021   MCV 100 (H) 02/22/2021   PLT 211 02/22/2021   Obesity Behavioral Intervention:   Approximately 15 minutes were spent on the discussion below.  ASK: We discussed the diagnosis of obesity with Markayla today and Tracee agreed to give [place] permission to discuss obesity behavioral modification therapy today.  ASSESS: Donovan has the diagnosis of obesity and her BMI today is 32.9. Leida is in the action stage of change.   ADVISE: Meiah was educated on the multiple health risks of obesity as well as the benefit of weight loss to improve her health. She was advised of the need for long term treatment and the importance of lifestyle modifications to improve her current health and to decrease her risk of future health problems.  AGREE: Multiple dietary modification options and treatment options were discussed and Annell agreed to follow the recommendations documented in the above note.  ARRANGE: Dallie was educated on the importance of frequent visits to treat obesity as outlined per CMS and USPSTF guidelines and agreed to schedule her next follow up appointment today.  Attestation Statements:   Reviewed by clinician on day of visit: allergies, medications, problem list, medical history, surgical history, family history, social history, and previous encounter notes.  I, Insurance claims handler, CMA, am acting as Energy manager for [employer], DO  I have reviewed the above documentation for accuracy and completeness, and I agree with the above. Corinna Capra, DO

## 2021-03-09 ENCOUNTER — Encounter (INDEPENDENT_AMBULATORY_CARE_PROVIDER_SITE_OTHER): Payer: Self-pay | Admitting: Bariatrics

## 2021-03-17 ENCOUNTER — Other Ambulatory Visit: Payer: Self-pay

## 2021-03-17 ENCOUNTER — Encounter (INDEPENDENT_AMBULATORY_CARE_PROVIDER_SITE_OTHER): Payer: Self-pay | Admitting: Bariatrics

## 2021-03-17 ENCOUNTER — Ambulatory Visit (INDEPENDENT_AMBULATORY_CARE_PROVIDER_SITE_OTHER): Payer: Medicare PPO | Admitting: Bariatrics

## 2021-03-17 VITALS — BP 132/74 | Temp 97.7°F | Ht 64.0 in | Wt 190.0 lb

## 2021-03-17 DIAGNOSIS — E669 Obesity, unspecified: Secondary | ICD-10-CM

## 2021-03-17 DIAGNOSIS — E782 Mixed hyperlipidemia: Secondary | ICD-10-CM

## 2021-03-17 DIAGNOSIS — E559 Vitamin D deficiency, unspecified: Secondary | ICD-10-CM

## 2021-03-17 DIAGNOSIS — Z6833 Body mass index (BMI) 33.0-33.9, adult: Secondary | ICD-10-CM | POA: Diagnosis not present

## 2021-03-17 MED ORDER — VITAMIN D (ERGOCALCIFEROL) 1.25 MG (50000 UNIT) PO CAPS: 50000.0000 [IU] | ORAL_CAPSULE | ORAL | 0 refills | Status: DC

## 2021-03-17 NOTE — Progress Notes (Signed)
Chief Complaint:   OBESITY Rachel Ayers is here to discuss her progress with her obesity treatment plan along with follow-up of her obesity related diagnoses. Rachel Ayers is on the Category 1 Plan and states she is following her eating plan approximately 100% of the time. Rachel Ayers states she is doing water aerobics for 60 minutes 1 times per week.  Today's visit was #: 3 Starting weight: 192 lbs Starting date: 02/19/2021 Today's weight: 190 lbs Today's date: 03/17/2021 Total lbs lost to date: 2 lbs Total lbs lost since last in-office visit: 1 lb  Interim History: Rachel Ayers is down 1 lb. She is not eating vegan food.   Subjective:   1. Vitamin D insufficiency Rachel Ayers is taking Vitamin D currently.  2. Mixed hyperlipidemia Rachel Ayers is currently taking Zocor.  Assessment/Plan:   1. Vitamin D insufficiency Low Vitamin D level contributes to fatigue and are associated with obesity, breast, and colon cancer. We will refill prescription Vitamin D 50,000 IU every week for 1 month with no refills and Rachel Ayers will follow-up for routine testing of Vitamin D, at least 2-3 times per year to avoid over-replacement.  - Vitamin D, Ergocalciferol, (DRISDOL) 1.25 MG (50000 UNIT) CAPS capsule; Take 1 capsule (50,000 Units total) by mouth every 7 (seven) days.  Dispense: 4 capsule; Refill: 0  2. Mixed hyperlipidemia Cardiovascular risk and specific lipid/LDL goals reviewed.  We discussed several lifestyle modifications today and Rachel Ayers will continue to work on diet, exercise and weight loss efforts. Rachel Ayers will continue Zocor. She will decrease fatty foods. A handout  was given for healthier, leaner protein was given today). Orders and follow up as documented in patient record.   Counseling Intensive lifestyle modifications are the first line treatment for this issue. Dietary changes: Increase soluble fiber. Decrease simple carbohydrates. Exercise changes: Moderate to vigorous-intensity aerobic activity 150 minutes  per week if tolerated. Lipid-lowering medications: see documented in medical record.   3. Obesity with current BMI 32.7 Rachel Ayers is currently in the action stage of change. As such, her goal is to continue with weight loss efforts. She has agreed to the Category 1 Plan.   Rachel Ayers will continue meal planning. She will be mindful eating.   Exercise goals: Rachel Ayers will continue water aerobics increasing her frequency and duration   Behavioral modification strategies: increasing lean protein intake, decreasing simple carbohydrates, increasing vegetables, increasing water intake, decreasing eating out, no skipping meals, meal planning and cooking strategies, keeping healthy foods in the home, and planning for success.  Rachel Ayers has agreed to follow-up with our clinic in 2 weeks. She was informed of the importance of frequent follow-up visits to maximize her success with intensive lifestyle modifications for her multiple health conditions.   Objective:   Blood pressure 132/74, temperature 97.7 F (36.5 C), height 5\' 4"  (1.626 m), weight 190 lb (86.2 kg), SpO2 100 %. Body mass index is 32.61 kg/m.  General: Cooperative, alert, well developed, in no acute distress. HEENT: Conjunctivae and lids unremarkable. Cardiovascular: Regular rhythm.  Lungs: Normal work of breathing. Neurologic: No focal deficits.   Lab Results  Component Value Date   CREATININE 1.14 (H) 02/22/2021   BUN 16 02/22/2021   NA 140 02/22/2021   K 5.0 02/22/2021   CL 100 02/22/2021   CO2 25 02/22/2021   Lab Results  Component Value Date   ALT 26 02/22/2021   AST 22 02/22/2021   ALKPHOS 59 02/22/2021   BILITOT 0.3 02/22/2021   Lab Results  Component Value Date  HGBA1C 6.7 (H) 02/22/2021   HGBA1C 6.1 (A) 10/15/2019   Lab Results  Component Value Date   INSULIN 13.3 02/22/2021   Lab Results  Component Value Date   TSH 2.590 02/22/2021   Lab Results  Component Value Date   CHOL 184 02/22/2021   HDL 50  02/22/2021   LDLCALC 106 (H) 02/22/2021   TRIG 161 (H) 02/22/2021   Lab Results  Component Value Date   VD25OH 39.8 02/22/2021   Lab Results  Component Value Date   WBC 6.4 02/22/2021   HGB 13.3 02/22/2021   HCT 44.1 02/22/2021   MCV 100 (H) 02/22/2021   PLT 211 02/22/2021   No results found for: IRON, TIBC, FERRITIN  Obesity Behavioral Intervention:   Approximately 15 minutes were spent on the discussion below.  ASK: We discussed the diagnosis of obesity with Rachel Ayers today and Rachel Ayers agreed to give [place] permission to discuss obesity behavioral modification therapy today.  ASSESS: Rachel Ayers has the diagnosis of obesity and her BMI today is 32.7. Mertie is in the action stage of change.   ADVISE: Rachel Ayers was educated on the multiple health risks of obesity as well as the benefit of weight loss to improve her health. She was advised of the need for long term treatment and the importance of lifestyle modifications to improve her current health and to decrease her risk of future health problems.  AGREE: Multiple dietary modification options and treatment options were discussed and Rachel Ayers agreed to follow the recommendations documented in the above note.  ARRANGE: Rachel Ayers was educated on the importance of frequent visits to treat obesity as outlined per CMS and USPSTF guidelines and agreed to schedule her next follow up appointment today.  Attestation Statements:   Reviewed by clinician on day of visit: allergies, medications, problem list, medical history, surgical history, family history, social history, and previous encounter notes.  I, Jackson Latino, RMA, am acting as Energy manager for [employer], DO.   I have reviewed the above documentation for accuracy and completeness, and I agree with the above. Corinna Capra, DO

## 2021-03-18 ENCOUNTER — Encounter (INDEPENDENT_AMBULATORY_CARE_PROVIDER_SITE_OTHER): Payer: Self-pay | Admitting: Bariatrics

## 2021-03-18 DIAGNOSIS — H5203 Hypermetropia, bilateral: Secondary | ICD-10-CM | POA: Diagnosis not present

## 2021-03-18 DIAGNOSIS — E119 Type 2 diabetes mellitus without complications: Secondary | ICD-10-CM | POA: Diagnosis not present

## 2021-03-21 DIAGNOSIS — G4733 Obstructive sleep apnea (adult) (pediatric): Secondary | ICD-10-CM | POA: Diagnosis not present

## 2021-04-05 ENCOUNTER — Ambulatory Visit (INDEPENDENT_AMBULATORY_CARE_PROVIDER_SITE_OTHER): Payer: Medicare PPO | Admitting: Physician Assistant

## 2021-04-08 ENCOUNTER — Other Ambulatory Visit: Payer: Self-pay

## 2021-04-08 ENCOUNTER — Encounter (INDEPENDENT_AMBULATORY_CARE_PROVIDER_SITE_OTHER): Payer: Self-pay | Admitting: Bariatrics

## 2021-04-08 ENCOUNTER — Ambulatory Visit (INDEPENDENT_AMBULATORY_CARE_PROVIDER_SITE_OTHER): Payer: Medicare PPO | Admitting: Bariatrics

## 2021-04-08 VITALS — BP 120/77 | HR 68 | Temp 98.1°F | Ht 64.0 in | Wt 187.0 lb

## 2021-04-08 DIAGNOSIS — E559 Vitamin D deficiency, unspecified: Secondary | ICD-10-CM | POA: Diagnosis not present

## 2021-04-08 DIAGNOSIS — E669 Obesity, unspecified: Secondary | ICD-10-CM

## 2021-04-08 DIAGNOSIS — E782 Mixed hyperlipidemia: Secondary | ICD-10-CM | POA: Diagnosis not present

## 2021-04-08 DIAGNOSIS — Z6833 Body mass index (BMI) 33.0-33.9, adult: Secondary | ICD-10-CM

## 2021-04-08 DIAGNOSIS — E1169 Type 2 diabetes mellitus with other specified complication: Secondary | ICD-10-CM | POA: Diagnosis not present

## 2021-04-08 MED ORDER — VITAMIN D (ERGOCALCIFEROL) 1.25 MG (50000 UNIT) PO CAPS: 50000.0000 [IU] | ORAL_CAPSULE | ORAL | 0 refills | Status: DC

## 2021-04-08 NOTE — Progress Notes (Signed)
Chief Complaint:   OBESITY Dniya is here to discuss her progress with her obesity treatment plan along with follow-up of her obesity related diagnoses. Lachell is on the Category 1 Plan and states she is following her eating plan approximately 80% of the time. Agatha states she is swimming for 50 minutes 2 times per week, and walking for 30 minutes 3 times per week.  Today's visit was #: 4 Starting weight: 192 lbs Starting date: 02/19/2021 Today's weight: 187 lbs Today's date: 04/08/2021 Total lbs lost to date: 5 Total lbs lost since last in-office visit: 3  Interim History: Helia is down an additional 3 lbs and she is doing well overall. She is eating about 1,000 calories and at least 70 grams of protein.  Subjective:   1. Vitamin D insufficiency Lizett is taking Vit D as directed.  2. Mixed hyperlipidemia Cela is taking Zocor currently.  3. Diabetes mellitus type 2 in obese (HCC) Antwan is taking Ozempic 0.5 mg per her primary care physician.  Assessment/Plan:   1. Vitamin D insufficiency Low Vitamin D level contributes to fatigue and are associated with obesity, breast, and colon cancer. We will refill prescription Vitamin D 50,000 IU every week for 1 month. Meghen will follow-up for routine testing of Vitamin D, at least 2-3 times per year to avoid over-replacement.  - Vitamin D, Ergocalciferol, (DRISDOL) 1.25 MG (50000 UNIT) CAPS capsule; Take 1 capsule (50,000 Units total) by mouth every 7 (seven) days.  Dispense: 4 capsule; Refill: 0  2. Mixed hyperlipidemia Cardiovascular risk and specific lipid/LDL goals reviewed. We discussed several lifestyle modifications today. Dyanara will continue her medications, and will continue to work on diet, exercise and weight loss efforts. Orders and follow up as documented in patient record.   Counseling Intensive lifestyle modifications are the first line treatment for this issue. Dietary changes: Increase soluble fiber.  Decrease simple carbohydrates. Exercise changes: Moderate to vigorous-intensity aerobic activity 150 minutes per week if tolerated. Lipid-lowering medications: see documented in medical record.  3. Diabetes mellitus type 2 in obese (HCC) Kassia is to call her primary care physician and ask about increases her Ozempic dose. Good blood sugar control is important to decrease the likelihood of diabetic complications such as nephropathy, neuropathy, limb loss, blindness, coronary artery disease, and death. Intensive lifestyle modification including diet, exercise and weight loss are the first line of treatment for diabetes.   4. Obesity with current BMI 32.2 Shameeka is currently in the action stage of change. As such, her goal is to continue with weight loss efforts. She has agreed to the Category 1 Plan.   Intentional eating was discussed. Brynlynn will be using the Lose It application.  Exercise goals: As is.  Behavioral modification strategies: increasing lean protein intake, decreasing simple carbohydrates, increasing vegetables, increasing water intake, decreasing eating out, no skipping meals, meal planning and cooking strategies, keeping healthy foods in the home, and planning for success.  Azora has agreed to follow-up with our clinic in 2 weeks. She was informed of the importance of frequent follow-up visits to maximize her success with intensive lifestyle modifications for her multiple health conditions.   Objective:   Blood pressure 120/77, pulse 68, temperature 98.1 F (36.7 C), height 5\' 4"  (1.626 m), weight 187 lb (84.8 kg), SpO2 96 %. Body mass index is 32.1 kg/m.  General: Cooperative, alert, well developed, in no acute distress. HEENT: Conjunctivae and lids unremarkable. Cardiovascular: Regular rhythm.  Lungs: Normal work of breathing. Neurologic: No  focal deficits.   Lab Results  Component Value Date   CREATININE 1.14 (H) 02/22/2021   BUN 16 02/22/2021   NA 140  02/22/2021   K 5.0 02/22/2021   CL 100 02/22/2021   CO2 25 02/22/2021   Lab Results  Component Value Date   ALT 26 02/22/2021   AST 22 02/22/2021   ALKPHOS 59 02/22/2021   BILITOT 0.3 02/22/2021   Lab Results  Component Value Date   HGBA1C 6.7 (H) 02/22/2021   HGBA1C 6.1 (A) 10/15/2019   Lab Results  Component Value Date   INSULIN 13.3 02/22/2021   Lab Results  Component Value Date   TSH 2.590 02/22/2021   Lab Results  Component Value Date   CHOL 184 02/22/2021   HDL 50 02/22/2021   LDLCALC 106 (H) 02/22/2021   TRIG 161 (H) 02/22/2021   Lab Results  Component Value Date   VD25OH 39.8 02/22/2021   Lab Results  Component Value Date   WBC 6.4 02/22/2021   HGB 13.3 02/22/2021   HCT 44.1 02/22/2021   MCV 100 (H) 02/22/2021   PLT 211 02/22/2021   No results found for: IRON, TIBC, FERRITIN  Attestation Statements:   Reviewed by clinician on day of visit: allergies, medications, problem list, medical history, surgical history, family history, social history, and previous encounter notes.   Trude Mcburney, am acting as Energy manager for [employer], DO.  I have reviewed the above documentation for accuracy and completeness, and I agree with the above. Corinna Capra, DO

## 2021-04-12 ENCOUNTER — Other Ambulatory Visit: Payer: Self-pay | Admitting: Family Medicine

## 2021-04-12 DIAGNOSIS — Z1231 Encounter for screening mammogram for malignant neoplasm of breast: Secondary | ICD-10-CM

## 2021-04-21 DIAGNOSIS — G4733 Obstructive sleep apnea (adult) (pediatric): Secondary | ICD-10-CM | POA: Diagnosis not present

## 2021-04-22 ENCOUNTER — Encounter (INDEPENDENT_AMBULATORY_CARE_PROVIDER_SITE_OTHER): Payer: Self-pay | Admitting: Bariatrics

## 2021-04-22 ENCOUNTER — Ambulatory Visit (INDEPENDENT_AMBULATORY_CARE_PROVIDER_SITE_OTHER): Payer: Medicare PPO | Admitting: Bariatrics

## 2021-04-22 ENCOUNTER — Other Ambulatory Visit: Payer: Self-pay

## 2021-04-22 VITALS — BP 148/80 | HR 60 | Temp 97.9°F | Ht 64.0 in | Wt 185.0 lb

## 2021-04-22 DIAGNOSIS — E559 Vitamin D deficiency, unspecified: Secondary | ICD-10-CM | POA: Diagnosis not present

## 2021-04-22 DIAGNOSIS — E669 Obesity, unspecified: Secondary | ICD-10-CM | POA: Diagnosis not present

## 2021-04-22 DIAGNOSIS — E782 Mixed hyperlipidemia: Secondary | ICD-10-CM | POA: Diagnosis not present

## 2021-04-22 DIAGNOSIS — Z6833 Body mass index (BMI) 33.0-33.9, adult: Secondary | ICD-10-CM | POA: Diagnosis not present

## 2021-04-22 MED ORDER — VITAMIN D (ERGOCALCIFEROL) 1.25 MG (50000 UNIT) PO CAPS: 50000.0000 [IU] | ORAL_CAPSULE | ORAL | 0 refills | Status: DC

## 2021-04-22 NOTE — Progress Notes (Signed)
Chief Complaint:   OBESITY Rachel Ayers is here to discuss her progress with her obesity treatment plan along with follow-up of her obesity related diagnoses. Rachel Ayers is on the Category 1 Plan and states she is following her eating plan approximately 90-95% of the time. Rachel Ayers states she is swimming for 45 minutes 3 times per week.  Today's visit was #: 5 Starting weight: 192 lbs Starting date: 02/19/2021 Today's weight: 185 lbs Today's date: 04/22/2021 Total lbs lost to date: 7 lbs Total lbs lost since last in-office visit: 2 lbs  Interim History: Rachel Ayers is down another 2 lbs.   Subjective:   1. Vitamin D insufficiency Rachel Ayers  is taking Vitamin D currently.   2. Mixed hyperlipidemia Rachel Ayers is currently not on medications.   Assessment/Plan:   1. Vitamin D insufficiency Low Vitamin D level contributes to fatigue and are associated with obesity, breast, and colon cancer. We will refill prescription Vitamin D 50,000 IU every week for 1 month with no refills and Lokelani will follow-up for routine testing of Vitamin D, at least 2-3 times per year to avoid over-replacement.  - Vitamin D, Ergocalciferol, (DRISDOL) 1.25 MG (50000 UNIT) CAPS capsule; Take 1 capsule (50,000 Units total) by mouth every 7 (seven) days.  Dispense: 4 capsule; Refill: 0  2. Mixed hyperlipidemia Cardiovascular risk and specific lipid/LDL goals reviewed.  We discussed several lifestyle modifications today and [name] will continue to work on diet, exercise and weight loss efforts. [name] will have no trans fats and she will decrease saturated fats. Orders and follow up as documented in patient record.   Counseling Intensive lifestyle modifications are the first line treatment for this issue. Dietary changes: Increase soluble fiber. Decrease simple carbohydrates. Exercise changes: Moderate to vigorous-intensity aerobic activity 150 minutes per week if tolerated. Lipid-lowering medications: see documented in medical  record.   3. Obesity with current BMI 32.2 Rachel Ayers is currently in the action stage of change. As such, her goal is to continue with weight loss efforts. She has agreed to the Category 1 Plan.   Rachel Ayers will continue meal planning and intentional eating.   Exercise goals:  Rachel Ayers will continue swimming 3 times per week.  Behavioral modification strategies: increasing lean protein intake, decreasing simple carbohydrates, increasing vegetables, increasing water intake, decreasing eating out, no skipping meals, meal planning and cooking strategies, keeping healthy foods in the home, and planning for success.  Rachel Ayers has agreed to follow-up with our clinic in 3 weeks at the end of the month (fasting). She was informed of the importance of frequent follow-up visits to maximize her success with intensive lifestyle modifications for her multiple health conditions.   Objective:   Blood pressure (!) 148/80, pulse 60, temperature 97.9 F (36.6 C), height 5\' 4"  (1.626 m), weight 185 lb (83.9 kg), SpO2 96 %. Body mass index is 31.76 kg/m.  General: Cooperative, alert, well developed, in no acute distress. HEENT: Conjunctivae and lids unremarkable. Cardiovascular: Regular rhythm.  Lungs: Normal work of breathing. Neurologic: No focal deficits.   Lab Results  Component Value Date   CREATININE 1.14 (H) 02/22/2021   BUN 16 02/22/2021   NA 140 02/22/2021   K 5.0 02/22/2021   CL 100 02/22/2021   CO2 25 02/22/2021   Lab Results  Component Value Date   ALT 26 02/22/2021   AST 22 02/22/2021   ALKPHOS 59 02/22/2021   BILITOT 0.3 02/22/2021   Lab Results  Component Value Date   HGBA1C 6.7 (H) 02/22/2021  HGBA1C 6.1 (A) 10/15/2019   Lab Results  Component Value Date   INSULIN 13.3 02/22/2021   Lab Results  Component Value Date   TSH 2.590 02/22/2021   Lab Results  Component Value Date   CHOL 184 02/22/2021   HDL 50 02/22/2021   LDLCALC 106 (H) 02/22/2021   TRIG 161 (H) 02/22/2021    Lab Results  Component Value Date   VD25OH 39.8 02/22/2021   Lab Results  Component Value Date   WBC 6.4 02/22/2021   HGB 13.3 02/22/2021   HCT 44.1 02/22/2021   MCV 100 (H) 02/22/2021   PLT 211 02/22/2021   No results found for: IRON, TIBC, FERRITIN  Attestation Statements:   Reviewed by clinician on day of visit: allergies, medications, problem list, medical history, surgical history, family history, social history, and previous encounter notes.  I, Lizbeth Bark, RMA, am acting as Location manager for [employer], DO.   I have reviewed the above documentation for accuracy and completeness, and I agree with the above. Jearld Lesch, DO

## 2021-04-23 ENCOUNTER — Encounter (INDEPENDENT_AMBULATORY_CARE_PROVIDER_SITE_OTHER): Payer: Self-pay | Admitting: Bariatrics

## 2021-05-11 ENCOUNTER — Ambulatory Visit (INDEPENDENT_AMBULATORY_CARE_PROVIDER_SITE_OTHER): Payer: Medicare PPO | Admitting: Bariatrics

## 2021-05-27 ENCOUNTER — Ambulatory Visit: Payer: Medicare PPO

## 2021-06-24 ENCOUNTER — Ambulatory Visit
Admission: RE | Admit: 2021-06-24 | Discharge: 2021-06-24 | Disposition: A | Discharge status: Home / Self Care | Payer: Medicare PPO | Source: Ambulatory Visit | Attending: Family Medicine | Admitting: Family Medicine

## 2021-06-24 ENCOUNTER — Other Ambulatory Visit: Payer: Self-pay

## 2021-06-24 DIAGNOSIS — Z1231 Encounter for screening mammogram for malignant neoplasm of breast: Secondary | ICD-10-CM | POA: Diagnosis not present

## 2021-06-24 IMAGING — MG MM DIGITAL SCREENING BILAT W/ TOMO AND CAD
6 of 10 series · 6 of 30 positions shown · non-contrast
Comparison: Previous exam(s).

CLINICAL DATA: Screening.

EXAM:
DIGITAL SCREENING BILATERAL MAMMOGRAM WITH TOMOSYNTHESIS AND CAD
TECHNIQUE: Bilateral screening digital craniocaudal and mediolateral oblique
mammograms were obtained. Bilateral screening digital breast
tomosynthesis was performed. The images were evaluated with
computer-aided detection.

[L CC synth-2D]
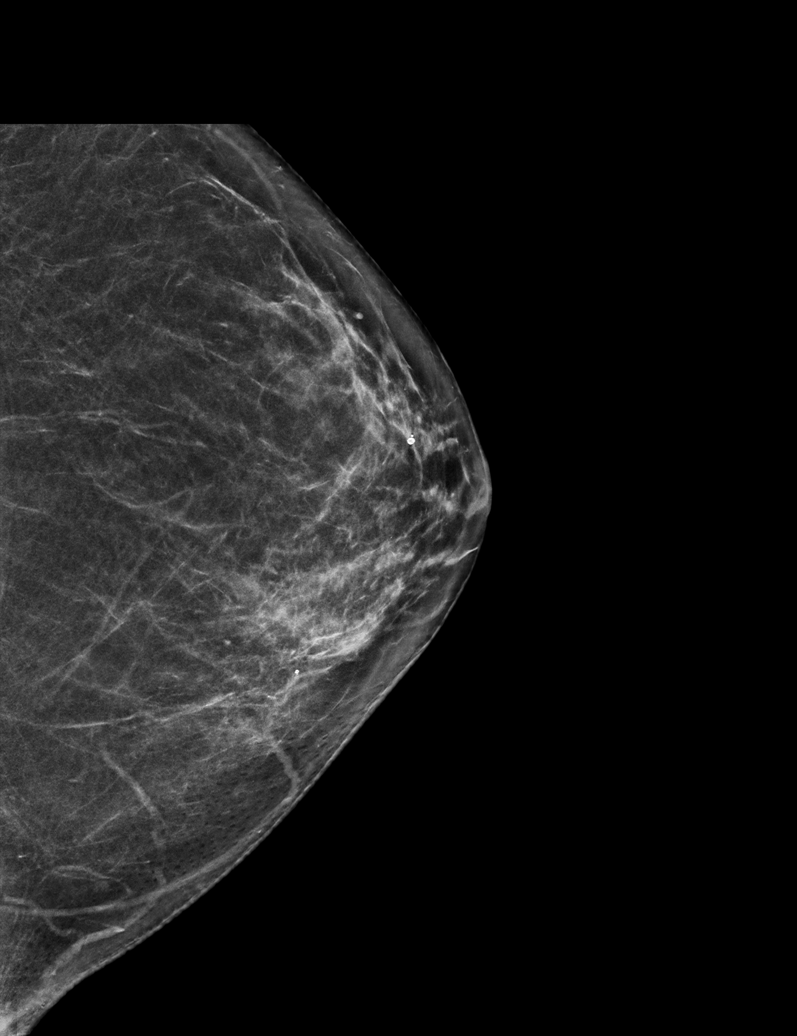

[R CC synth-2D]
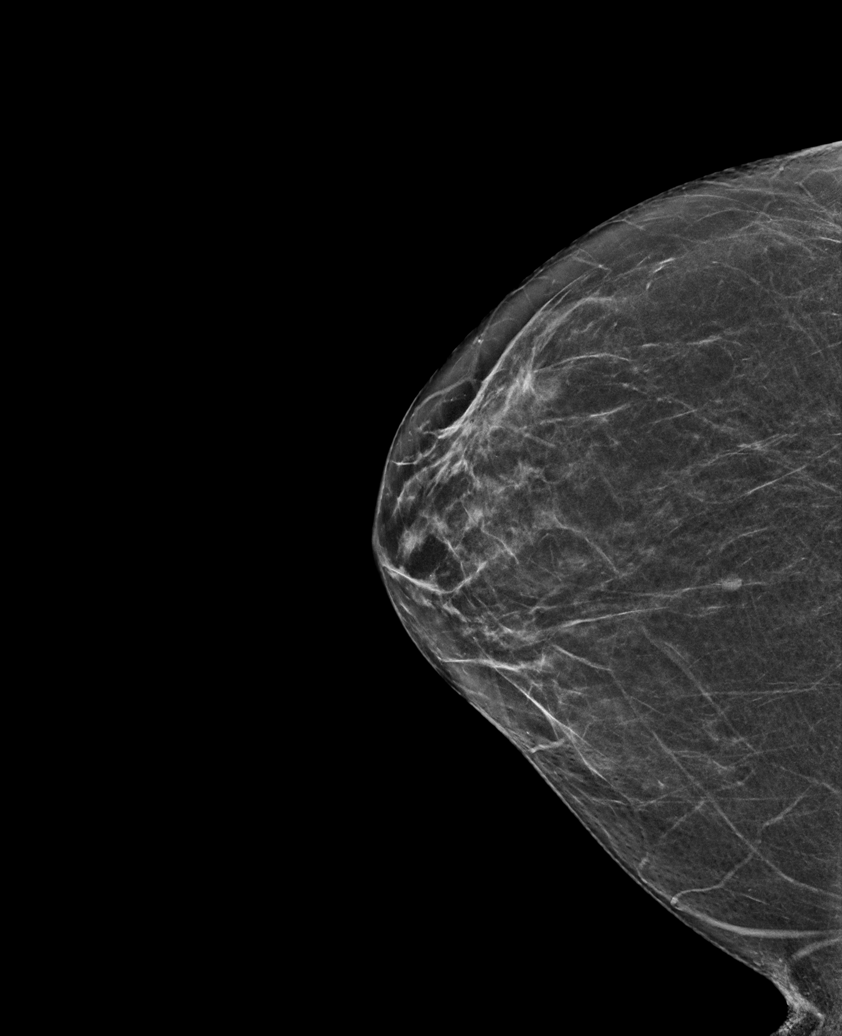

[R MLO synth-2D (1 of 2)]
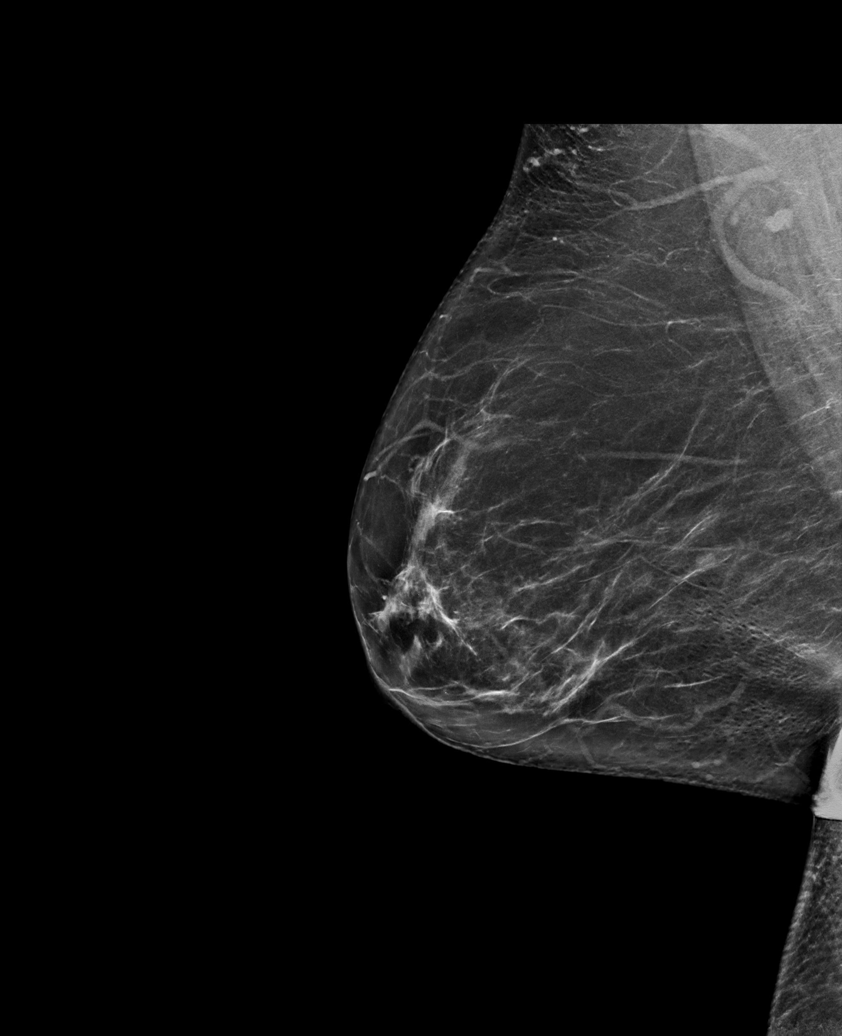

[L MLO synth-2D]
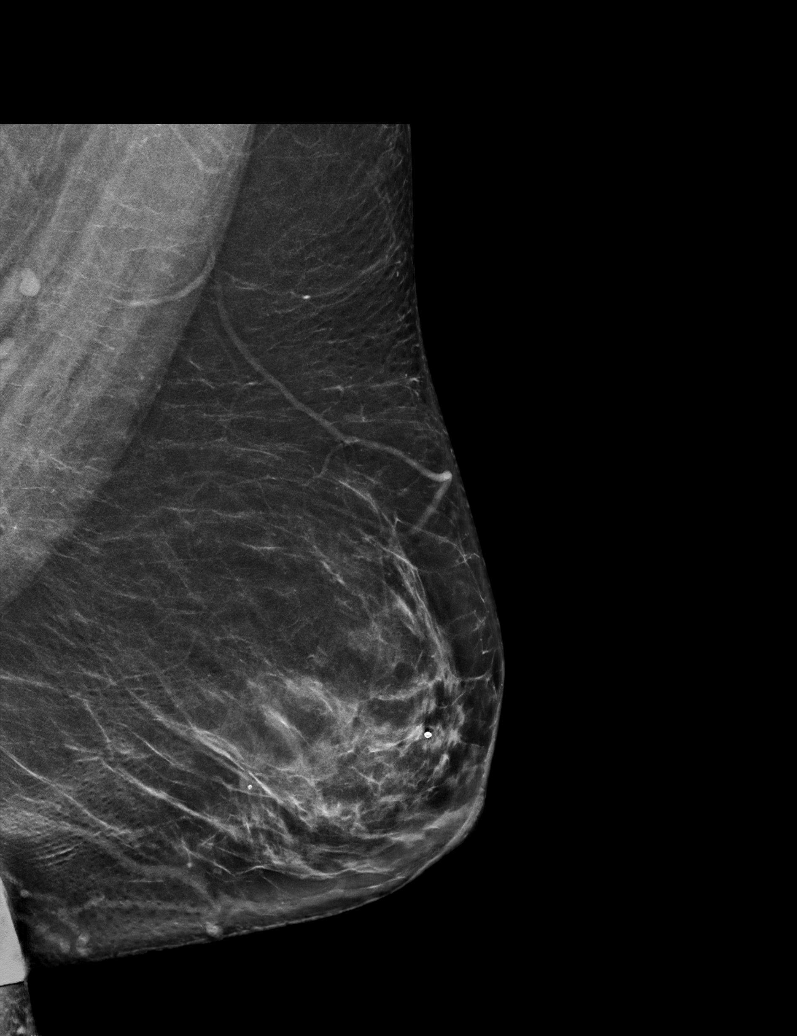

[R MLO synth-2D (2 of 2)]
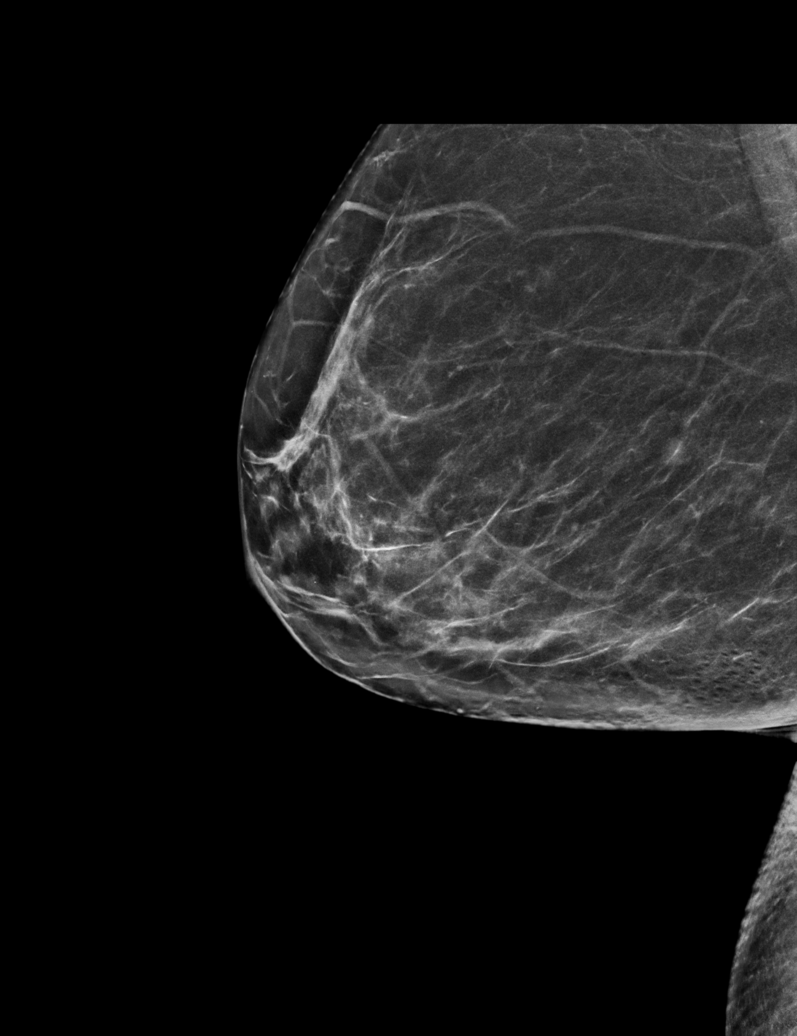

[L MLO tomo · tomo slice 36/71.0]
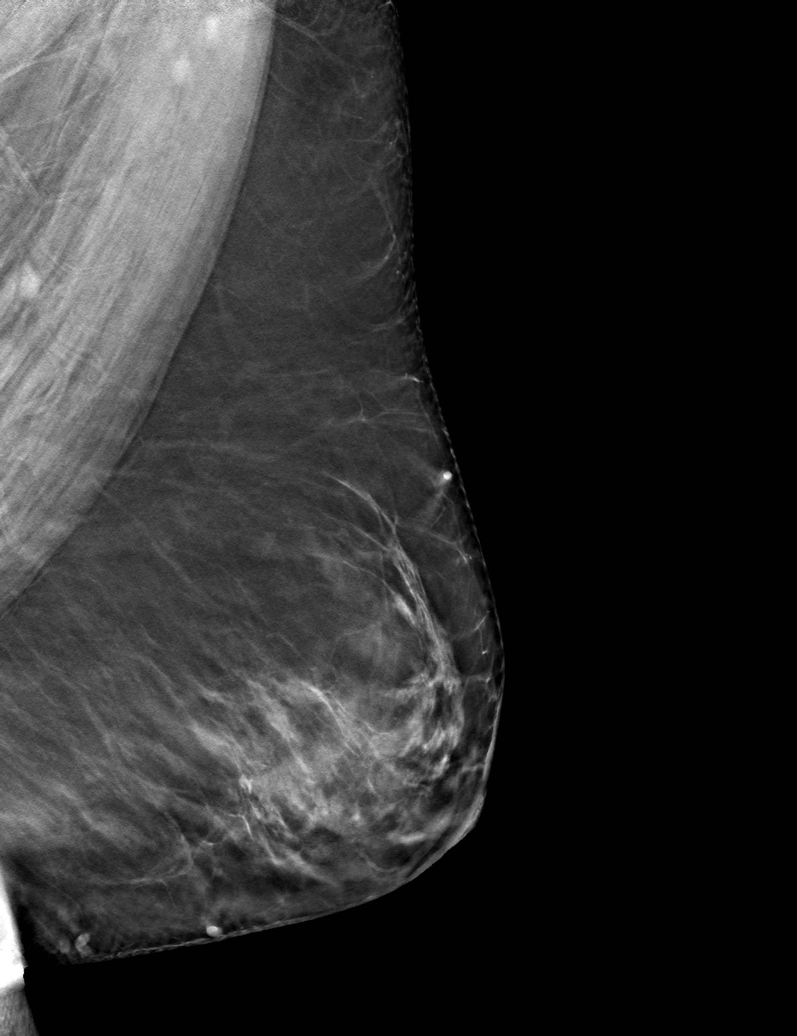

[6 of 30 positions shown; findings below may reference images not displayed]

ACR Breast Density Category b: There are scattered areas of
fibroglandular density.
FINDINGS: In the left breast, a possible asymmetry in the retroareolar breast
with possible correlate in the slightly lower breast on MLO view
warrants further evaluation (CC frame 54/59; MLO frame 56/71). Also
recommend repeat left CC view to optimize visualization of the far
lateral tissue. In the right breast, no findings suspicious for
malignancy.
IMPRESSION: Further evaluation is suggested for possible asymmetry in the left
breast. Also recommend repeat left CC view to optimize visualization
of the far lateral tissue.

RECOMMENDATION:
Diagnostic mammogram and possibly ultrasound of the left breast.
(Code:[Q0])

The patient will be contacted regarding the findings, and additional
imaging will be scheduled.

BI-RADS CATEGORY  0: Incomplete. Need additional imaging evaluation
and/or prior mammograms for comparison.

## 2021-06-29 ENCOUNTER — Other Ambulatory Visit: Payer: Self-pay | Admitting: Family Medicine

## 2021-06-29 DIAGNOSIS — R928 Other abnormal and inconclusive findings on diagnostic imaging of breast: Secondary | ICD-10-CM

## 2021-07-15 DIAGNOSIS — N1831 Chronic kidney disease, stage 3a: Secondary | ICD-10-CM | POA: Diagnosis not present

## 2021-07-20 ENCOUNTER — Ambulatory Visit
Admission: RE | Admit: 2021-07-20 | Discharge: 2021-07-20 | Disposition: A | Discharge status: Home / Self Care | Payer: Medicare PPO | Source: Ambulatory Visit | Attending: Family Medicine | Admitting: Family Medicine

## 2021-07-20 DIAGNOSIS — R928 Other abnormal and inconclusive findings on diagnostic imaging of breast: Secondary | ICD-10-CM | POA: Diagnosis not present

## 2021-07-20 DIAGNOSIS — N6489 Other specified disorders of breast: Secondary | ICD-10-CM | POA: Diagnosis not present

## 2021-07-20 DIAGNOSIS — R922 Inconclusive mammogram: Secondary | ICD-10-CM | POA: Diagnosis not present

## 2021-07-20 IMAGING — MG MM DIGITAL DIAGNOSTIC UNILAT*L* W/ TOMO W/ CAD
6 series · 6 of 18 positions shown · non-contrast
Comparison: Previous exam(s).

CLINICAL DATA: Screening recall for a left breast asymmetry.

EXAM:
DIGITAL DIAGNOSTIC UNILATERAL LEFT MAMMOGRAM WITH TOMOSYNTHESIS AND
CAD; ULTRASOUND LEFT BREAST LIMITED
TECHNIQUE: Left digital diagnostic mammography and breast tomosynthesis was
performed. The images were evaluated with computer-aided detection.;
Targeted ultrasound examination of the left breast was performed.

[L MLO synth-2D]
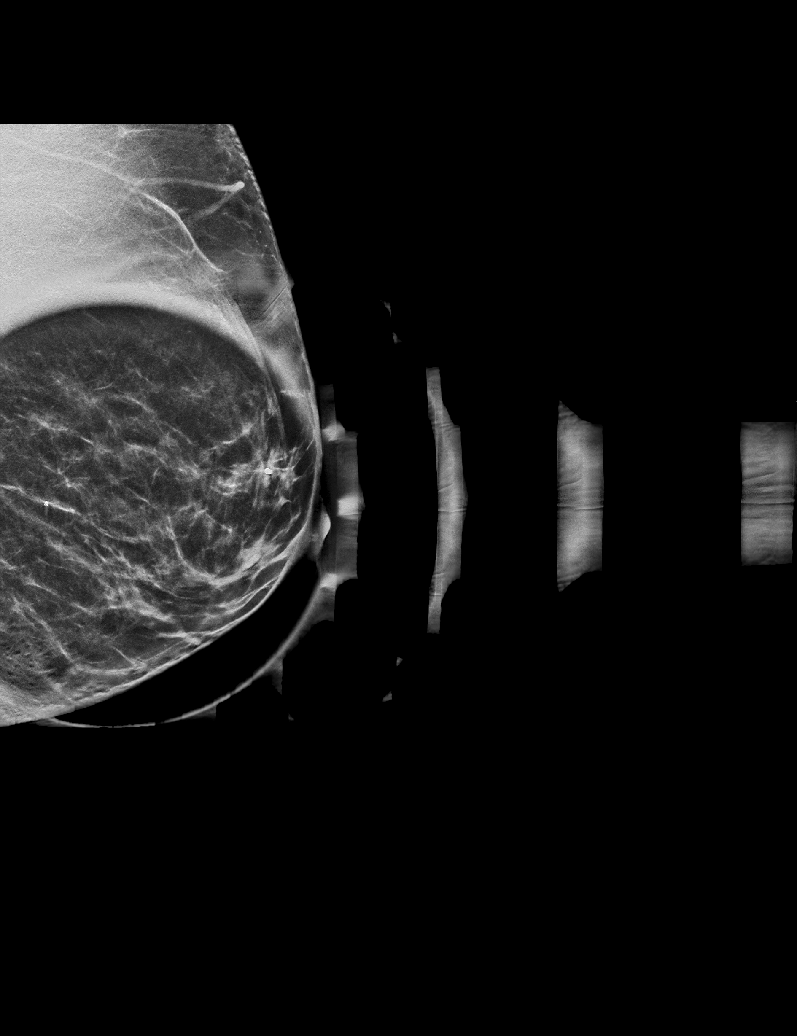

[L CC synth-2D (1 of 2)]
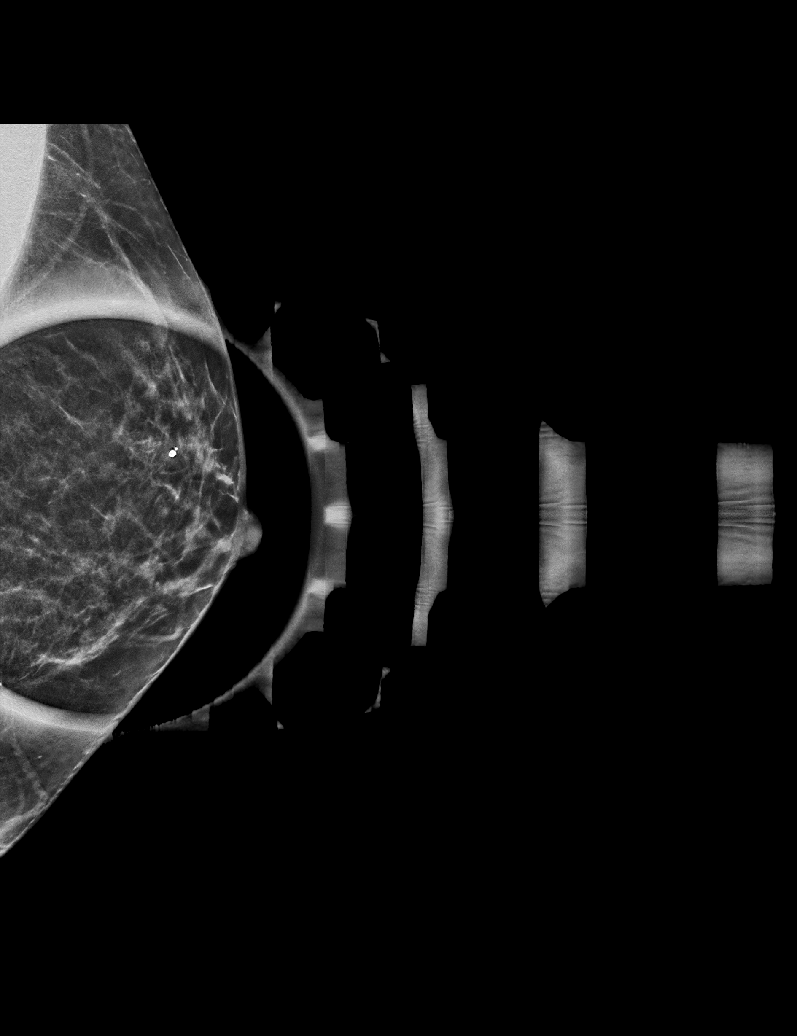

[L CC synth-2D (2 of 2)]
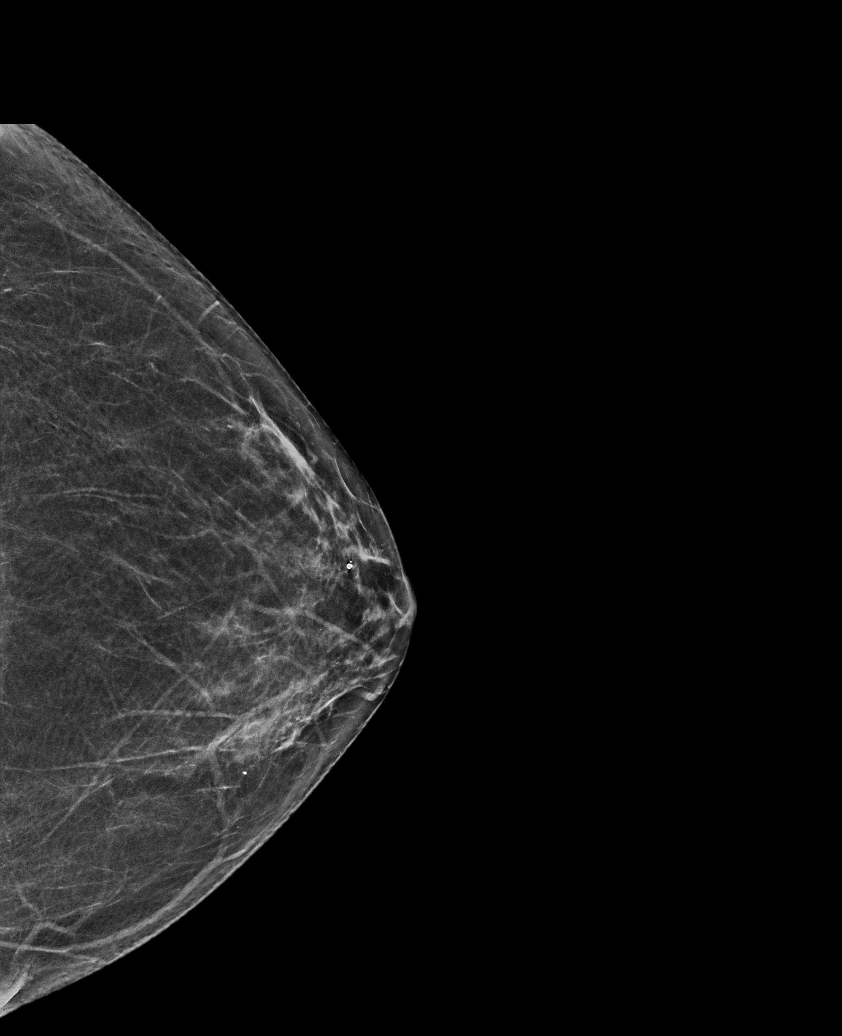

[L CC tomo (1 of 2) · tomo slice 29/58.0]
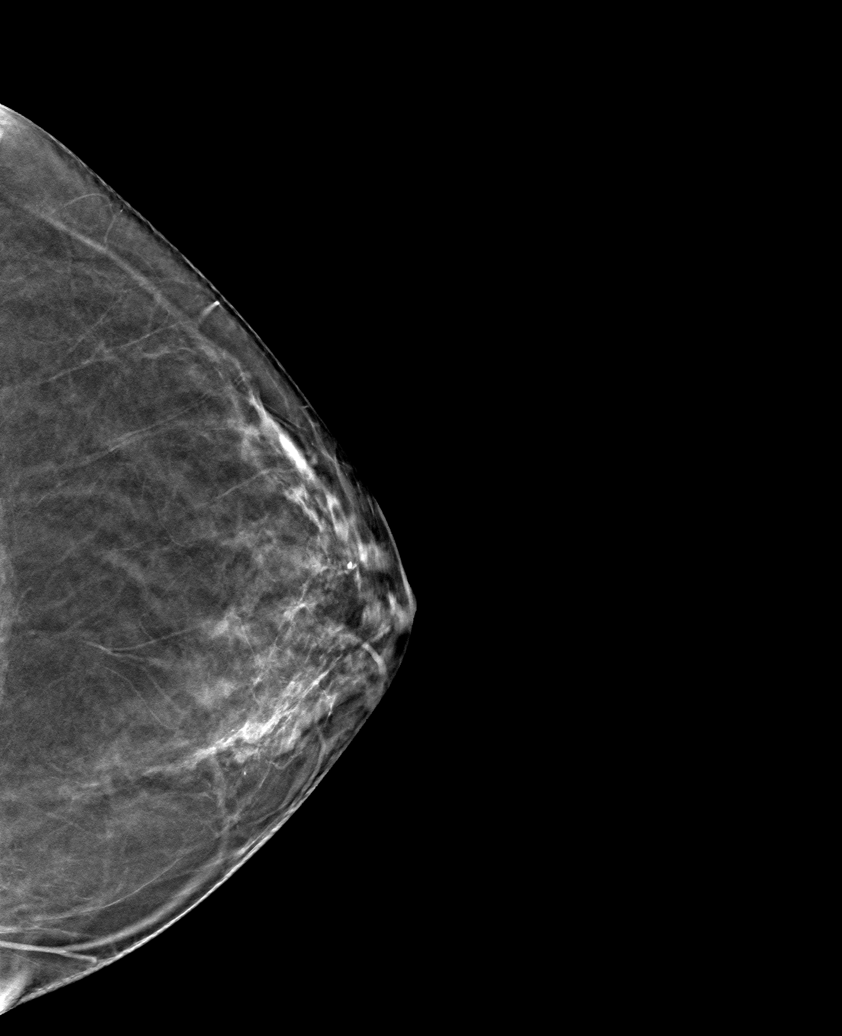

[L MLO tomo · tomo slice 30/59.0]
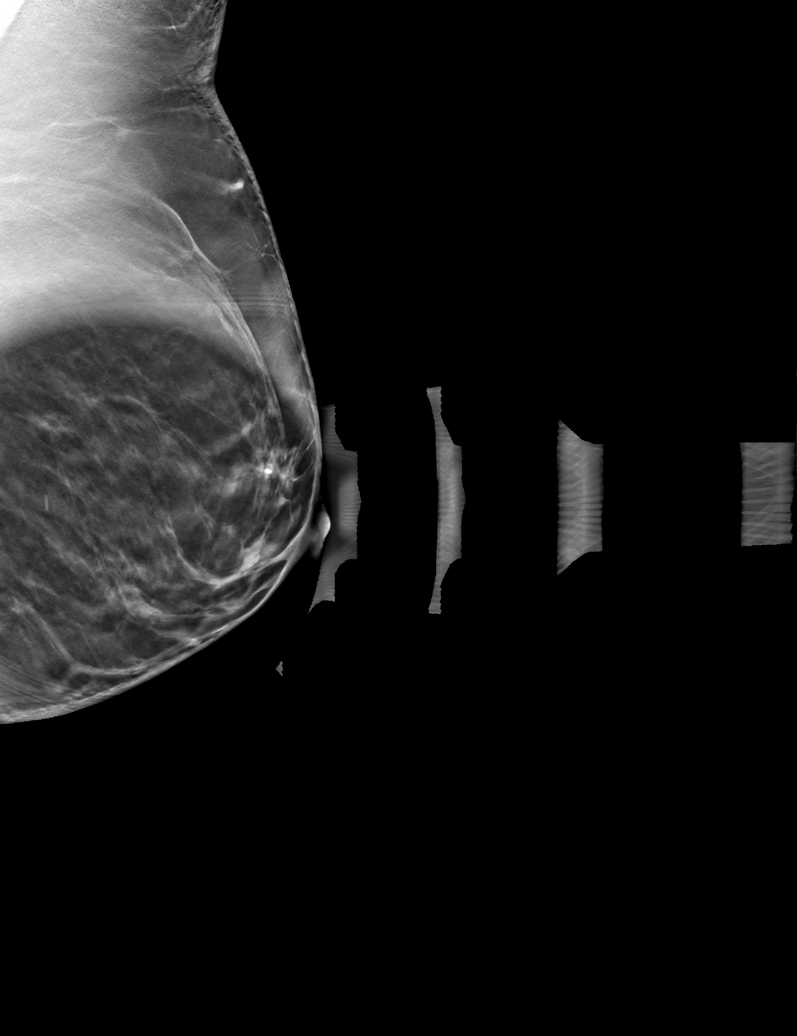

[L CC tomo (2 of 2) · tomo slice 23/44.0]
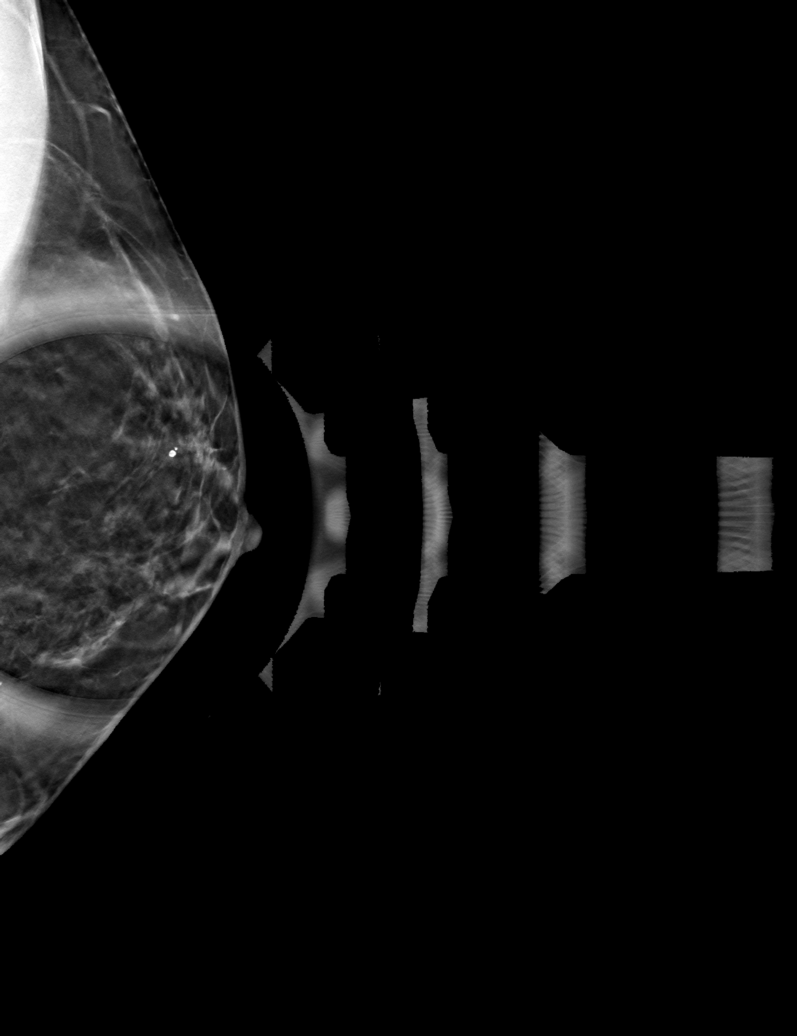

[6 of 18 positions shown; findings below may reference images not displayed]

ACR Breast Density Category b: There are scattered areas of
fibroglandular density.
FINDINGS: Spot compression tomosynthesis images through the retroareolar
slightly inferior left breast demonstrates a 4-5 mm oval
circumscribed mass.

Ultrasound of the retroareolar left breast demonstrates 2 adjacent
small anechoic cysts in the retroareolar left breast at 6 o'clock.
The larger measures 4 mm, and the smaller 3 mm.
IMPRESSION: Benign cysts in the retroareolar left breast.

RECOMMENDATION:
Screening mammogram in one year.(Code:[7U])

I have discussed the findings and recommendations with the patient.
If applicable, a reminder letter will be sent to the patient
regarding the next appointment.

BI-RADS CATEGORY  2: Benign.

## 2021-07-20 IMAGING — US US BREAST*L* LIMITED INC AXILLA
1 series · 6 of 6 positions shown · non-contrast
Comparison: Previous exam(s).

CLINICAL DATA: Screening recall for a left breast asymmetry.

EXAM:
DIGITAL DIAGNOSTIC UNILATERAL LEFT MAMMOGRAM WITH TOMOSYNTHESIS AND
CAD; ULTRASOUND LEFT BREAST LIMITED
TECHNIQUE: Left digital diagnostic mammography and breast tomosynthesis was
performed. The images were evaluated with computer-aided detection.;
Targeted ultrasound examination of the left breast was performed.

[Series 1: us breast*left* limited inc axilla · 0.06mm/px · 6 of 6 slices shown]
[im 1/6]
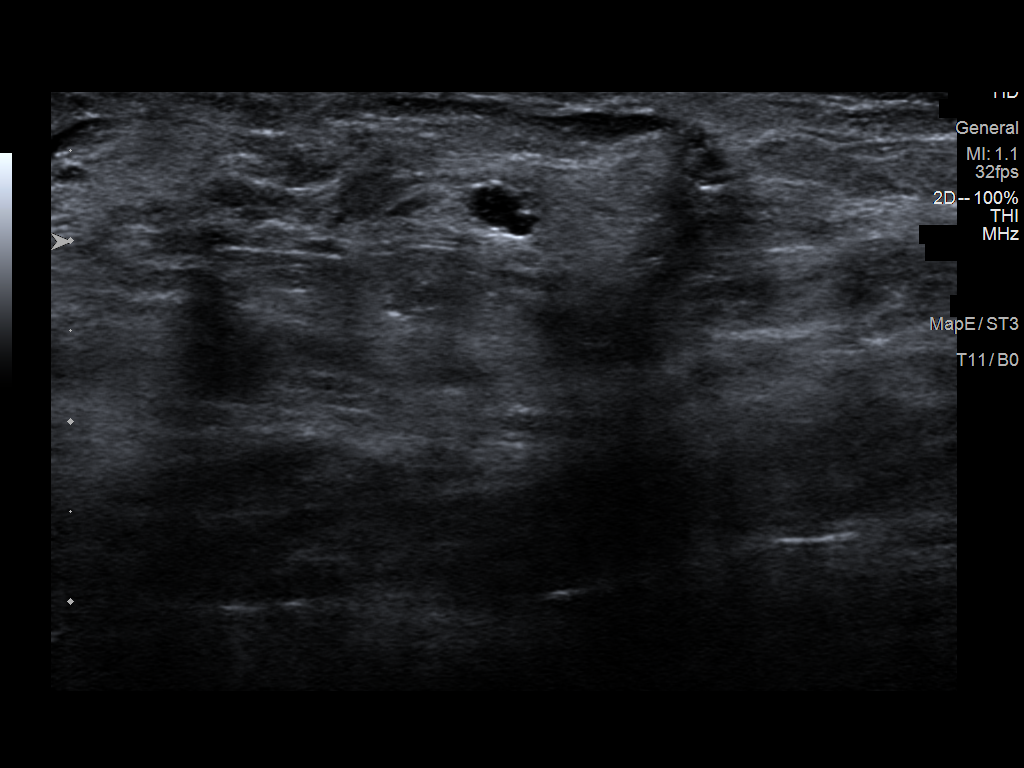
[im 2/6]
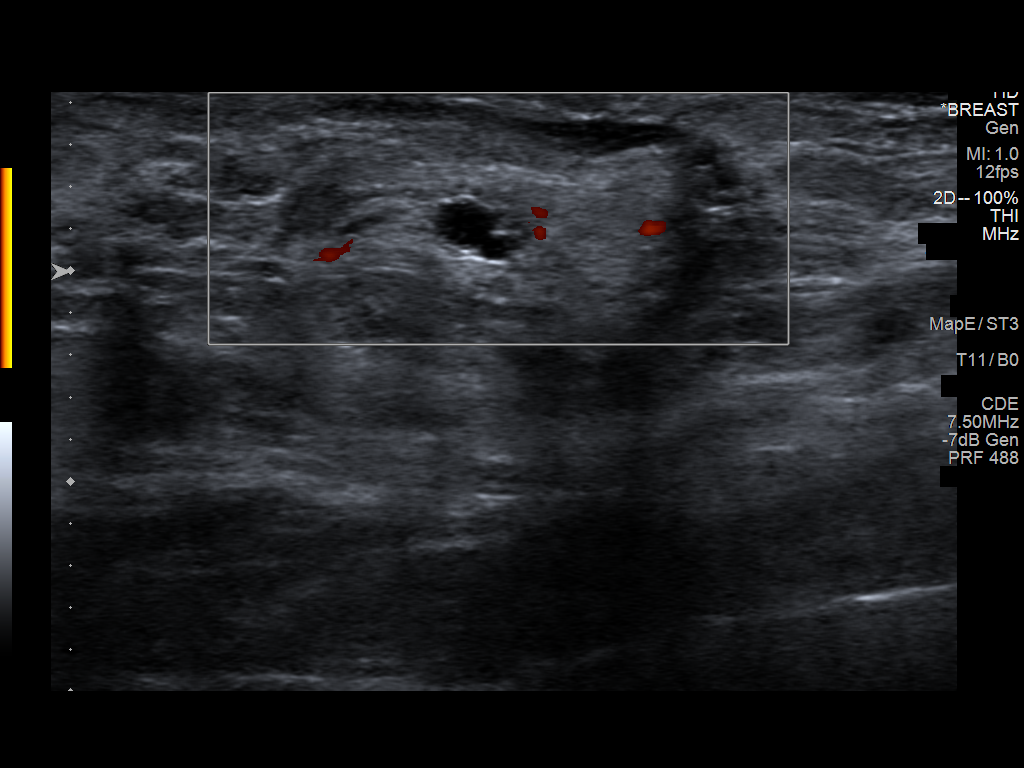
[im 3/6]
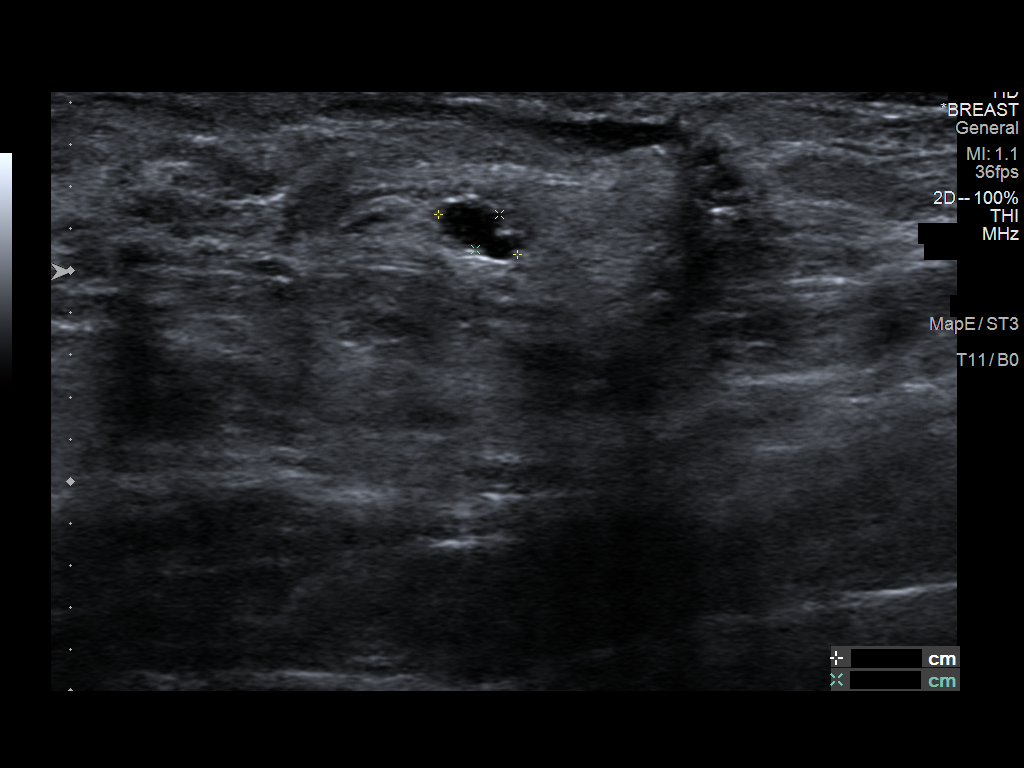
[im 4/6]
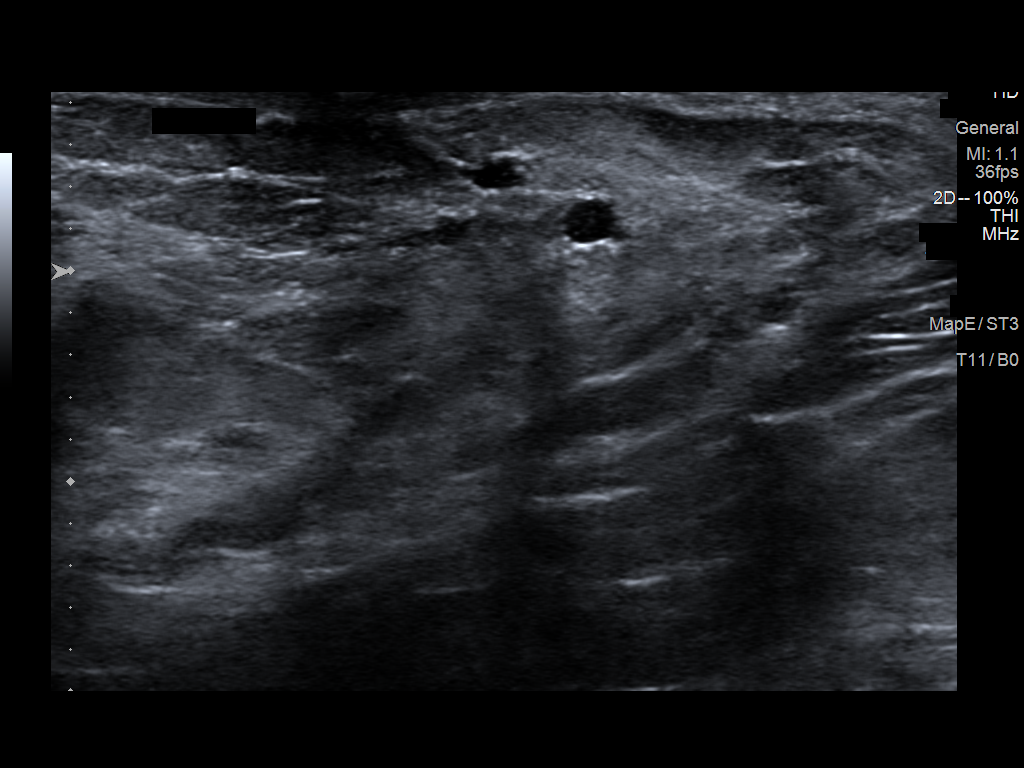
[im 5/6]
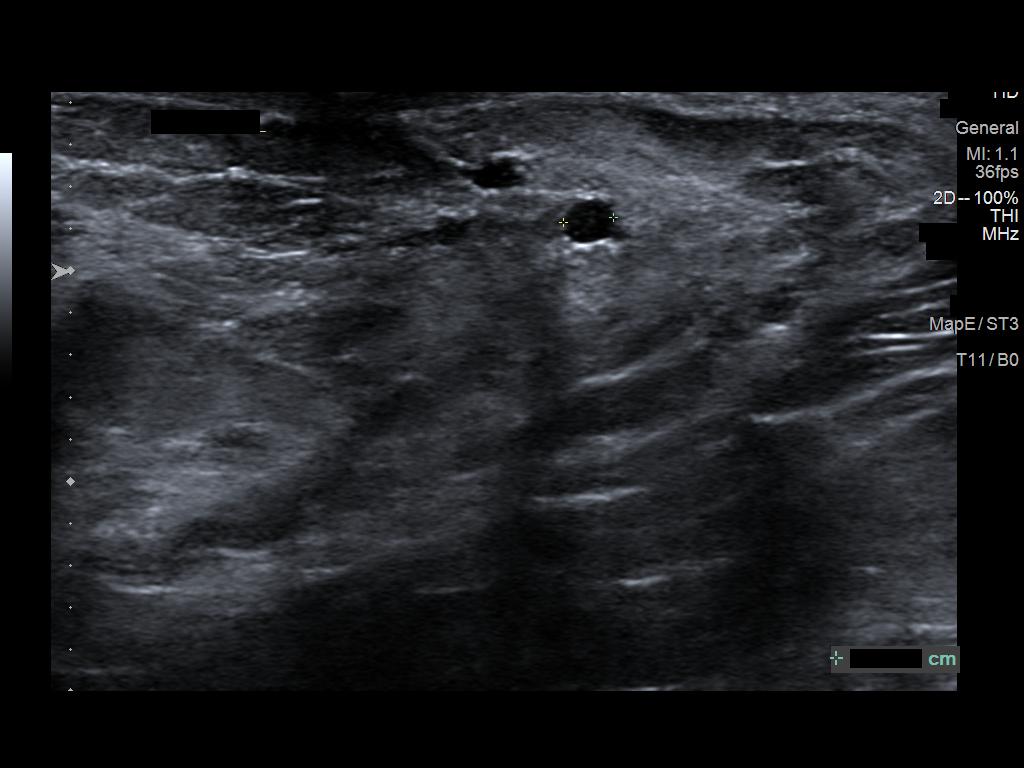
[im 6/6]
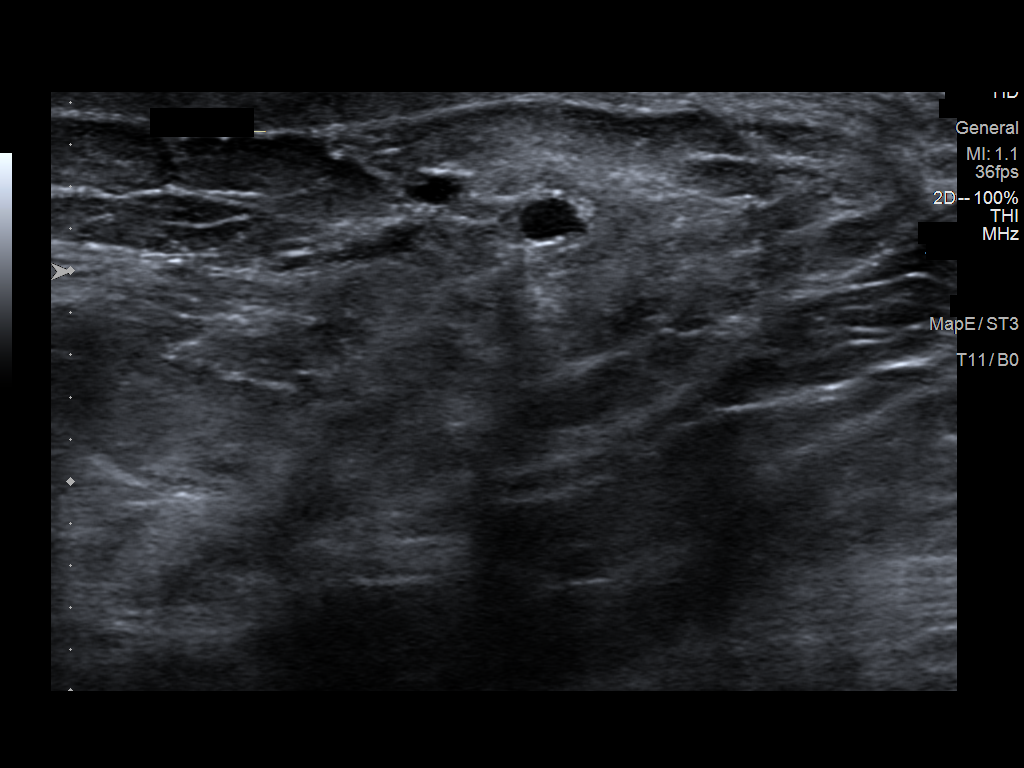

[6 of 6 positions shown; findings below may reference images not displayed]

ACR Breast Density Category b: There are scattered areas of
fibroglandular density.
FINDINGS: Spot compression tomosynthesis images through the retroareolar
slightly inferior left breast demonstrates a 4-5 mm oval
circumscribed mass.

Ultrasound of the retroareolar left breast demonstrates 2 adjacent
small anechoic cysts in the retroareolar left breast at 6 o'clock.
The larger measures 4 mm, and the smaller 3 mm.
IMPRESSION: Benign cysts in the retroareolar left breast.

RECOMMENDATION:
Screening mammogram in one year.(Code:[7U])

I have discussed the findings and recommendations with the patient.
If applicable, a reminder letter will be sent to the patient
regarding the next appointment.

BI-RADS CATEGORY  2: Benign.

## 2021-07-21 DIAGNOSIS — N1831 Chronic kidney disease, stage 3a: Secondary | ICD-10-CM | POA: Diagnosis not present

## 2021-07-21 DIAGNOSIS — E1122 Type 2 diabetes mellitus with diabetic chronic kidney disease: Secondary | ICD-10-CM | POA: Diagnosis not present

## 2021-07-21 DIAGNOSIS — E669 Obesity, unspecified: Secondary | ICD-10-CM | POA: Diagnosis not present

## 2021-07-21 DIAGNOSIS — E785 Hyperlipidemia, unspecified: Secondary | ICD-10-CM | POA: Diagnosis not present

## 2021-07-21 DIAGNOSIS — I129 Hypertensive chronic kidney disease with stage 1 through stage 4 chronic kidney disease, or unspecified chronic kidney disease: Secondary | ICD-10-CM | POA: Diagnosis not present

## 2021-07-27 ENCOUNTER — Ambulatory Visit: Payer: Medicare PPO | Admitting: Internal Medicine

## 2021-07-27 ENCOUNTER — Other Ambulatory Visit: Payer: Self-pay

## 2021-07-27 ENCOUNTER — Encounter: Payer: Self-pay | Admitting: Internal Medicine

## 2021-07-27 VITALS — BP 139/75 | HR 54 | Ht 64.0 in | Wt 185.0 lb

## 2021-07-27 DIAGNOSIS — I5181 Takotsubo syndrome: Secondary | ICD-10-CM | POA: Diagnosis not present

## 2021-07-27 DIAGNOSIS — I358 Other nonrheumatic aortic valve disorders: Secondary | ICD-10-CM

## 2021-07-27 DIAGNOSIS — E782 Mixed hyperlipidemia: Secondary | ICD-10-CM

## 2021-07-27 DIAGNOSIS — I253 Aneurysm of heart: Secondary | ICD-10-CM | POA: Diagnosis not present

## 2021-07-27 NOTE — Progress Notes (Signed)
Cardiology Office Note:    Date:  07/27/2021   ID:  Donnelly Stager, DOB 07-May-1956, MRN [id number]  PCP:  Lujean Amel, MD  [facility] Cardiologist:  Werner Lean, MD  [place] Electrophysiologist:  None   Referring MD: Lujean Amel, MD   CC: Follow up  History of Present Illness:    Rachel Ayers is a 66 y.o. female with a hx of Takotsubo Cardiomyopathy (Patent LHC 2010 at 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 In 2021 Had echocardiogram with question of RV dysfunction with CMR showing no RV dysfunction.  Had some evidence of atrial septal aneurysm without evidence of PFO.   Patient notes that she is doing well.   She has started swimming and has lost significant weight Up to 70 laps Is fatigued after that many labs. There are no interval hospital/ED visit.    No chest pain or pressure .  No SOB/DOE and no PND/Orthopnea.  No weight gain or leg swelling.  No palpitations or syncope.   Past Medical History:  Diagnosis Date   Anemia    Anxiety    Asthma    CAD (coronary artery disease)    Chest pain    Constipation    Depression    DM (diabetes mellitus) (HCC)    Estrogen deficiency    Fatty liver    GERD (gastroesophageal reflux disease)    HTN (hypertension)    Hypercholesteremia    Kidney problem    MI (myocardial infarction) ([place])    Morbid obesity (HCC)    OSA (obstructive sleep apnea)    Palpitations    Vitamin D deficiency    Past Surgical History:  Procedure Laterality Date   left hip replacement      Current Medications: Current Meds  Medication Sig   buPROPion (WELLBUTRIN SR) 200 MG 12 hr tablet Take 200 mg by mouth daily.   cholecalciferol (VITAMIN D3) 25 MCG (1000 UNIT) tablet Take 1,000 Units by mouth daily.   escitalopram  (LEXAPRO) 20 MG tablet Take 20 mg by mouth daily.   lansoprazole (PREVACID) 15 MG capsule Take 15 mg by mouth daily at 12 noon.   loratadine (CLARITIN) 10 MG tablet Take 10 mg by mouth daily.   METOPROLOL SUCCINATE ER PO Take 25 mg by mouth daily.    mometasone (NASONEX) 50 MCG/ACT nasal spray daily.   Multiple Vitamins-Minerals (MULTIPLE VITAMINS/WOMENS PO) Take by mouth daily.   olmesartan (BENICAR) 20 MG tablet Take 10 mg by mouth daily.   OZEMPIC, 1 MG/DOSE, 4 MG/3ML SOPN Inject into the skin once a week.   simvastatin (ZOCOR) 40 MG tablet Take 40 mg by mouth daily.   SYMBICORT 160-4.5 MCG/ACT inhaler Inhale into the lungs daily at 6 (six) AM.   [DISCONTINUED] olmesartan (BENICAR) 5 MG tablet Take 10 mg by mouth daily.   [DISCONTINUED] Semaglutide (OZEMPIC, 0.25 OR 0.5 MG/DOSE, [place]) Inject 0.5 mLs into the skin.   [DISCONTINUED] Vitamin D, Ergocalciferol, (DRISDOL) 1.25 MG (50000 UNIT) CAPS capsule Take 1 capsule (50,000 Units total) by mouth every 7 (seven) days.    Allergies:   Sulfa antibiotics   Social History   Socioeconomic History   Marital status: Divorced    Spouse name: Not on file   Number of children: Not on file   Years of education:  Not on file   Highest education level: Not on file  Occupational History   Occupation: Retired Metallurgist  Tobacco Use   Smoking status: Never   Smokeless tobacco: Never  Substance and Sexual Activity   Alcohol use: Never   Drug use: Never   Sexual activity: Never  Other Topics Concern   Not on file  Social History Narrative   Not on file   Social Determinants of Health   Financial Resource Strain: Not on file  Food Insecurity: Not on file  Transportation Needs: Not on file  Physical Activity: Not on file  Stress: Not on file  Social Connections: Not on file    Family History: The patient's family history includes Cancer in her mother; Depression in her mother; Diabetes in her father; Heart disease in her father; High  Cholesterol in her mother; High blood pressure in her father and mother; Obesity in her father. Sister had Mi at age 26 (also had tobacco use) Father had heart disease NOS.  ROS:   Please see the history of present illness.    All other systems reviewed and are negative.  EKGs/Labs/Other Studies Reviewed:    The following studies were reviewed today:  EKG:   07/27/21: Sinus bradycardia rate 54 02/26/20: Sinus rhythm rate of 60 normal axis, q wave in V1, borderline criteria for anterior infarct  Cardiac Event Monitoring- OSH Dr. Carlena Bjornstad: Results: PVC and PACs on last Holter Monitor  Transthoracic Echocardiogram: Date: 03/09/20 Results:  1. Left ventricular ejection fraction, by estimation, is 65 to 70%. The  left ventricle has normal function. The left ventricle has no regional  wall motion abnormalities. There is mild left ventricular hypertrophy.  Left ventricular diastolic parameters  are indeterminate.   2. Endocardiurm of RV very difficult to see. Definity did not help. RV  function appears reduced . Right ventricular systolic function was not  well visualized. The right ventricular size is not well visualized.   3. The mitral valve is normal in structure. Trivial mitral valve  regurgitation.   4. The aortic valve is abnormal. Aortic valve regurgitation is not  visualized. Mild aortic valve sclerosis is present, with no evidence of  aortic valve stenosis.   5. The inferior vena cava is normal in size with greater than 50%  respiratory variability, suggesting right atrial pressure of 3 mmHg.   Cardiac MR: Date: 03/31/20 Results: FINDINGS: 1. Normal left ventricular size, LVEDVI 38 ml/m2. Mild, concentric LV thickness with normal LV Mass (Myo mass/BSA 59 g/m2). Hyperdynamic LV systolic function (LVEF =24%). There are no regional wall motion abnormalities.  There is no late gadolinium enhancement in the left ventricular myocardium.  2. Normal right ventricular size and  thickness, RVEDVI 46 ml/m2. Normal RV systolic function (RVEF =58%). There are no regional wall motion abnormalities or aneurysms. No criteria for ARVC met.  3. Normal left and right atrial size. Evidence of atrial septal aneurysm.  4. Normal size of the aortic root, ascending aorta and pulmonary artery.  5.  No significant valvular abnormalities.  6.  Normal pericardium.  No pericardial effusion.   IMPRESSION: Normal biventricular function without evidence of late gadolinium enhancement.  No criteria for ARVC met.  Left/Right Heart Catheterizations: Date: 03/22/09 Results: OSH LCP:03/22/09- EF 40% LVEDP 18, Nondominant Circ; Large RCA (dominant vessel) widely patent coronary arteries  Recent Labs: 02/22/2021: ALT 26; BUN 16; Creatinine, Ser 1.14; Hemoglobin 13.3; Platelets 211; Potassium 5.0; Sodium 140; TSH 2.590  Recent Lipid Panel  Component Value Date/Time   CHOL 184 02/22/2021 0000   TRIG 161 (H) 02/22/2021 0000   HDL 50 02/22/2021 0000   LDLCALC 106 (H) 02/22/2021 0000     Physical Exam:    VS:  BP 139/75    Pulse (!) 54    Ht $R'5\' 4"'AP$  (1.626 m)    Wt 83.9 kg    SpO2 96%    BMI 31.76 kg/m     Wt Readings from Last 3 Encounters:  07/27/21 83.9 kg  04/22/21 83.9 kg  04/08/21 84.8 kg    Gen: No distress   Neck: No JVD Cardiac: No Rubs or Gallops, no Murmur, regular bradycardia, +2 radial pulses Respiratory: Clear to auscultation bilaterally, normal effort, normal  respiratory rate GI: Soft, nontender, non-distended  MS: No  edema;  moves all extremities Integument: Skin feels warm Neuro:  At time of evaluation, alert and oriented to person/place/time/situation  Psych: Normal affect, patient feels well   ASSESSMENT:    1. Takotsubo cardiomyopathy   2. Aortic valve sclerosis   3. Atrial septal aneurysm   4. Mixed hyperlipidemia    PLAN:    History of Takostubo Cardiomyopathy PVCs and PACs HTN and DM OSA on CPAP - stable on metoprolol succinate;  continue current regimen - Amb BP at goal  Hyperlipidemia (Mixed) Aortic Sclerosis - LDL goal less than 70 -continue current statin - patient would like  to stay off increase zovor; we will have her check lipids at [facility]; she will continue her swimming and has lost significant intentional weight (with swimming)  Atrial Septal Aneurysm - discussed PFO risk; monitor   Medication Adjustments/Labs and Tests Ordered: Current medicines are reviewed at length with the patient today.  Concerns regarding medicines are outlined above.  Orders Placed This Encounter  Procedures   EKG 12-Lead   No orders of the defined types were placed in this encounter.   Patient Instructions  Medication Instructions:  Your physician recommends that you continue on your current medications as directed. Please refer to the Current Medication list given to you today.  *If you need a refill on your cardiac medications before your next appointment, please call your pharmacy*   Lab Work: NONE If you have labs (blood work) drawn today and your tests are completely normal, you will receive your results only by: [place] (if you have MyChart) OR A paper copy in the mail If you have any lab test that is abnormal or we need to change your treatment, we will call you to review the results.   Testing/Procedures: NONE   Follow-Up: At [facility], you and your health needs are our priority.  As part of our continuing mission to provide you with exceptional heart care, we have created designated Provider Care Teams.  These Care Teams include your primary Cardiologist (physician) and Advanced Practice Providers (APPs -  Physician Assistants and Nurse Practitioners) who all work together to provide you with the care you need, when you need it.   Your next appointment:   1 year(s)  The format for your next appointment:   In Person  Provider:   Werner Lean, MD       Signed, Werner Lean, MD  07/27/2021 3:10 PM    [place]

## 2021-07-27 NOTE — Patient Instructions (Signed)
Medication Instructions:  °Your physician recommends that you continue on your current medications as directed. Please refer to the Current Medication list given to you today. ° °*If you need a refill on your cardiac medications before your next appointment, please call your pharmacy* ° ° °Lab Work: °NONE °If you have labs (blood work) drawn today and your tests are completely normal, you will receive your results only by: °MyChart Message (if you have MyChart) OR °A paper copy in the mail °If you have any lab test that is abnormal or we need to change your treatment, we will call you to review the results. ° ° °Testing/Procedures: °NONE ° ° °Follow-Up: °At [facility], you and your health needs are our priority.  As part of our continuing mission to provide you with exceptional heart care, we have created designated Provider Care Teams.  These Care Teams include your primary Cardiologist (physician) and Advanced Practice Providers (APPs -  Physician Assistants and Nurse Practitioners) who all work together to provide you with the care you need, when you need it. ° ° °Your next appointment:   °1 year(s) ° °The format for your next appointment:   °In Person ° °Provider:   °Mahesh A Chandrasekhar, MD  °  ° ° ° ° °

## 2021-07-30 ENCOUNTER — Other Ambulatory Visit: Payer: Medicare PPO

## 2021-08-04 DIAGNOSIS — E1169 Type 2 diabetes mellitus with other specified complication: Secondary | ICD-10-CM | POA: Diagnosis not present

## 2021-08-04 DIAGNOSIS — N1831 Chronic kidney disease, stage 3a: Secondary | ICD-10-CM | POA: Diagnosis not present

## 2021-08-04 DIAGNOSIS — E038 Other specified hypothyroidism: Secondary | ICD-10-CM | POA: Diagnosis not present

## 2021-08-04 DIAGNOSIS — E1165 Type 2 diabetes mellitus with hyperglycemia: Secondary | ICD-10-CM | POA: Diagnosis not present

## 2021-08-04 DIAGNOSIS — J4541 Moderate persistent asthma with (acute) exacerbation: Secondary | ICD-10-CM | POA: Diagnosis not present

## 2021-09-15 ENCOUNTER — Encounter: Payer: Self-pay | Admitting: Internal Medicine

## 2021-09-15 DIAGNOSIS — I739 Peripheral vascular disease, unspecified: Secondary | ICD-10-CM

## 2021-09-20 ENCOUNTER — Ambulatory Visit (INDEPENDENT_AMBULATORY_CARE_PROVIDER_SITE_OTHER): Payer: Medicare PPO

## 2021-09-20 ENCOUNTER — Ambulatory Visit: Payer: Medicare PPO | Admitting: Physician Assistant

## 2021-09-20 DIAGNOSIS — M25552 Pain in left hip: Secondary | ICD-10-CM

## 2021-09-20 DIAGNOSIS — M545 Low back pain, unspecified: Secondary | ICD-10-CM

## 2021-09-20 MED ORDER — METHYLPREDNISOLONE 4 MG PO TBPK: ORAL_TABLET | ORAL | 0 refills | Status: DC
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
## 2021-10-06 ENCOUNTER — Ambulatory Visit (HOSPITAL_COMMUNITY)
Admission: RE | Admit: 2021-10-06 | Discharge: 2021-10-06 | Disposition: A | Discharge status: Home / Self Care | Payer: Medicare PPO | Source: Ambulatory Visit | Attending: Cardiovascular Disease | Admitting: Cardiovascular Disease

## 2021-10-06 ENCOUNTER — Telehealth: Payer: Self-pay | Admitting: Internal Medicine

## 2021-10-06 DIAGNOSIS — I739 Peripheral vascular disease, unspecified: Secondary | ICD-10-CM

## 2021-10-06 NOTE — Telephone Encounter (Signed)
Pt states that she was told that she may need a ultrasound done on her veins. Pt states they told her that they have openings on Monday (5/1) at 8 am and 9 am. Pt stated that she is leaving the country on Wednesday (5/3) and would like to have this done by then if Dr. Edwyna Perfect sees that it's fit. Please advise ?

## 2021-10-06 NOTE — Telephone Encounter (Signed)
Spoke with pt and advised Dr Izora Ribas and his nurse are not in the office this afternoon but will forward the message for review and recommendation. Pt verbalizes understanding and agrees with current plan. ?

## 2021-10-07 NOTE — Telephone Encounter (Signed)
Called pt in regards to venous study of lower extremities.  Pt reports going on a long flight and was mentioning this to ultrasound tech.  Mentioned that sister has a history of DVT.  Per pt tech advised that a venous study is needed.  Pt then called into the office to schedule test. I advised pt that venous study are done for suspension of blood clot. Pt denies redness/ tenderness to lower extremities.  Advised pt that MD has not suggested that test is needed at this time.  Pt expresses that may have over reacted based on conversation with tech.  Advised pt that if symptoms develop to let our office know.  No further questions or concerns.  ?

## 2021-10-07 NOTE — Telephone Encounter (Signed)
Okay. So she was not having symptoms and just took a long flight. Is that right? ?

## 2021-10-13 ENCOUNTER — Telehealth: Payer: Self-pay

## 2021-10-13 MED ORDER — CLOPIDOGREL BISULFATE 75 MG PO TABS: 75.0000 mg | ORAL_TABLET | Freq: Every day | ORAL | 3 refills | Status: DC

## 2021-10-13 MED ORDER — CLOPIDOGREL BISULFATE 75 MG PO TABS
75.0000 mg | ORAL_TABLET | Freq: Every day | ORAL | 3 refills | Status: DC
Start: 2021-10-13 — End: 2021-10-13

## 2021-10-13 NOTE — Telephone Encounter (Signed)
The patient has been notified of the result and verbalized understanding.  All questions (if any) were answered. ?Macie Burows, RN 10/13/2021 11:07 AM   ?

## 2021-10-13 NOTE — Telephone Encounter (Signed)
-----   Message from Werner Lean, MD sent at 10/10/2021  7:58 PM EDT ----- ?Results: ?Mild PAD (left TBI) ?Plan: ?Plavix 75 mh PO daily  ?Exercise ? ?Werner Lean, MD ? ?

## 2021-10-13 NOTE — Addendum Note (Signed)
Addended by: Macie Burows on: 10/13/2021 11:07 AM ? ? Modules accepted: Orders ? ?

## 2021-11-01 ENCOUNTER — Telehealth: Payer: Self-pay | Admitting: *Deleted

## 2021-11-01 NOTE — Telephone Encounter (Signed)
   Pre-operative Risk Assessment    Patient Name: Rachel Ayers  DOB: [birth year] MRN: [id number]      Request for Surgical Clearance    Procedure:   COLONOSCOPY   Date of Surgery:  Clearance TBD                                 Surgeon:  DR. Steward [facility] Surgeon's Group or Practice Name:  [facility] Phone number:  [phone] Fax number:  [phone]   Type of Clearance Requested:   - Medical  - Pharmacy:  Hold Clopidogrel (Plavix)     Type of Anesthesia:   PROPOFOL   Additional requests/questions:    Elpidio Anis   11/01/2021, 1:27 PM

## 2021-11-01 NOTE — Telephone Encounter (Signed)
Dr. Gasper Sells, may patient hold Plavix for upcoming colonoscopy?   Recent LE Art with ABIs 10/07/21 Results: Mild PAD (left TBI) Plan: Plavix 75 mh PO daily  Exercise   Werner Lean, MD  Please direct your response to p cv div preop  Once response is received, we will schedule patient for a virtual/telephone visit.   Thank you, Emmaline Life, NP-C    11/01/2021, 2:53 PM [facility] [id number], [address] Office [phone] Fax [phone]

## 2021-11-02 ENCOUNTER — Telehealth: Payer: Self-pay

## 2021-11-02 NOTE — Telephone Encounter (Signed)
  Patient Consent for Virtual Visit        Rachel Ayers has provided verbal consent on 11/02/2021 for a virtual visit (video or telephone).   CONSENT FOR VIRTUAL VISIT FOR:  Rachel Ayers  By participating in this virtual visit I agree to the following:  I hereby voluntarily request, consent and authorize [facility] and its employed or contracted physicians, physician assistants, nurse practitioners or other licensed health care professionals (the Practitioner), to provide me with telemedicine health care services (the "Services") as deemed necessary by the treating Practitioner. I acknowledge and consent to receive the Services by the Practitioner via telemedicine. I understand that the telemedicine visit will involve communicating with the Practitioner through live audiovisual communication technology and the disclosure of certain medical information by electronic transmission. I acknowledge that I have been given the opportunity to request an in-person assessment or other available alternative prior to the telemedicine visit and am voluntarily participating in the telemedicine visit.  I understand that I have the right to withhold or withdraw my consent to the use of telemedicine in the course of my care at any time, without affecting my right to future care or treatment, and that the Practitioner or I may terminate the telemedicine visit at any time. I understand that I have the right to inspect all information obtained and/or recorded in the course of the telemedicine visit and may receive copies of available information for a reasonable fee.  I understand that some of the potential risks of receiving the Services via telemedicine include:  Delay or interruption in medical evaluation due to technological equipment failure or disruption; Information transmitted may not be sufficient (e.g. poor resolution of images) to allow for appropriate medical decision making by the Practitioner; and/or   In rare instances, security protocols could fail, causing a breach of personal health information.  Furthermore, I acknowledge that it is my responsibility to provide information about my medical history, conditions and care that is complete and accurate to the best of my ability. I acknowledge that Practitioner's advice, recommendations, and/or decision may be based on factors not within their control, such as incomplete or inaccurate data provided by me or distortions of diagnostic images or specimens that may result from electronic transmissions. I understand that the practice of medicine is not an exact science and that Practitioner makes no warranties or guarantees regarding treatment outcomes. I acknowledge that a copy of this consent can be made available to me via my patient portal ([facility]), or I can request a printed copy by calling the office of [facility].    I understand that my insurance will be billed for this visit.   I have read or had this consent read to me. I understand the contents of this consent, which adequately explains the benefits and risks of the Services being provided via telemedicine.  I have been provided ample opportunity to ask questions regarding this consent and the Services and have had my questions answered to my satisfaction. I give my informed consent for the services to be provided through the use of telemedicine in my medical care

## 2021-11-02 NOTE — Telephone Encounter (Addendum)
Pt is scheduled for a tele preop visit on 5/24 for clearance. Consent and med rec reviewed

## 2021-11-02 NOTE — Telephone Encounter (Signed)
Primary Cardiologist:Mahesh A Chandrasekhar, MD  Chart reviewed as part of pre-operative protocol coverage. Because of Rachel Ayers's past medical history and time since last visit, he/she will require a virtual visit/telephone call in order to better assess preoperative cardiovascular risk.  Pre-op covering staff: - Please contact patient, obtain consent, and schedule appointment   Per Dr. Izora Ribas, patient may hold Plavix for 5-7 days prior to procedure.   Levi Aland, NP-C    11/02/2021, 8:23 AM [facility] [address], [address] Office [phone] Fax [phone]

## 2021-11-03 ENCOUNTER — Ambulatory Visit (INDEPENDENT_AMBULATORY_CARE_PROVIDER_SITE_OTHER): Payer: Medicare PPO | Admitting: General Practice

## 2021-11-03 DIAGNOSIS — Z01810 Encounter for preprocedural cardiovascular examination: Secondary | ICD-10-CM

## 2021-11-03 NOTE — Progress Notes (Signed)
Virtual Visit via Telephone Note   Because of Rachel Ayers's co-morbid illnesses, she is at least at moderate risk for complications without adequate follow up.  This format is felt to be most appropriate for this patient at this time.  The patient did not have access to video technology/had technical difficulties with video requiring transitioning to audio format only (telephone).  All issues noted in this document were discussed and addressed.  No physical exam could be performed with this format.  Please refer to the patient's chart for her consent to telehealth for [facility].  Evaluation Performed:  Preoperative cardiovascular risk assessment _____________   Date:  11/03/2021   Patient ID:  Rachel Ayers, DOB 02/20/1956, MRN [id number] Patient Location:  Home Provider location:   Office  Primary Care Provider:  Darrow Bussing, MD Primary Cardiologist:  Christell Constant, MD  Chief Complaint / Patient Profile   66 y.o. y/o female with a h/o Takotsubo cardiomyopathy, aortic valve sclerosis, atrial septal aneurysm, hyperlipidemia who is pending colonoscopy and presents today for telephonic preoperative cardiovascular risk assessment.  Past Medical History    Past Medical History:  Diagnosis Date   Anemia    Anxiety    Asthma    CAD (coronary artery disease)    Chest pain    Constipation    Depression    DM (diabetes mellitus) (HCC)    Estrogen deficiency    Fatty liver    GERD (gastroesophageal reflux disease)    HTN (hypertension)    Hypercholesteremia    Kidney problem    MI (myocardial infarction) (HCC)    Morbid obesity (HCC)    OSA (obstructive sleep apnea)    Palpitations    Vitamin D deficiency    Past Surgical History:  Procedure Laterality Date   left hip replacement      Allergies  Allergies  Allergen Reactions   Sulfa Antibiotics     History of Present Illness    Rachel Ayers is a 66 y.o. female who presents via audio/video  conferencing for a telehealth visit today.  Pt was last seen in cardiology clinic on 07/27/2021 by Dr. Izora Ribas.  At that time OLIVIYA GILKISON was doing well .  The patient is now pending procedure as outlined above. Since her last visit, she is stable from a cardiac standpoint.  Today she denies chest pain, shortness of breath, lower extremity edema, fatigue, palpitations, melena, hematuria, hemoptysis, diaphoresis, weakness, presyncope, syncope, orthopnea, and PND.    Home Medications    Prior to Admission medications   Medication Sig Start Date End Date Taking? Authorizing Provider  buPROPion (WELLBUTRIN SR) 200 MG 12 hr tablet Take 200 mg by mouth daily.    [provider]  cholecalciferol (VITAMIN D3) 25 MCG (1000 UNIT) tablet Take 1,000 Units by mouth daily.    [provider]  clopidogrel (PLAVIX) 75 MG tablet Take 1 tablet (75 mg total) by mouth daily. 10/13/21   Chandrasekhar, Mahesh A, MD  escitalopram (LEXAPRO) 20 MG tablet Take 20 mg by mouth daily.    [provider]  lansoprazole (PREVACID) 15 MG capsule Take 15 mg by mouth daily at 12 noon.    [provider]  loratadine (CLARITIN) 10 MG tablet Take 10 mg by mouth daily.    [provider]  METOPROLOL SUCCINATE ER PO Take 25 mg by mouth daily.     [provider]  mometasone (NASONEX) 50 MCG/ACT nasal spray daily.  [provider]  Multiple Vitamins-Minerals (MULTIPLE VITAMINS/WOMENS PO) Take by mouth daily.    [provider]  olmesartan (BENICAR) 20 MG tablet Take 10 mg by mouth daily. 07/16/21   [provider]  OZEMPIC, 1 MG/DOSE, 4 MG/3ML SOPN Inject into the skin once a week. 07/08/21   [provider]  simvastatin (ZOCOR) 40 MG tablet Take 40 mg by mouth daily.    [provider]  SYMBICORT 160-4.5 MCG/ACT inhaler Inhale into the lungs daily at 6 (six) AM. Patient not taking: Reported on 11/02/2021 05/13/21   [provider]    Physical Exam    Vital Signs:  JACALYNN BUZZELL does not have vital signs available for review today.  Given telephonic nature of communication, physical exam is limited. AAOx3. NAD. Normal affect.  Speech and respirations are unlabored.  Accessory Clinical Findings    None  Assessment & Plan    1.  Preoperative Cardiovascular Risk Assessment: Colonoscopy, Dr. Marca Ancona     Primary Cardiologist: Christell Constant, MD  Chart reviewed as part of pre-operative protocol coverage. Given past medical history and time since last visit, based on ACC/AHA guidelines, Melizza G Plourde would be at acceptable risk for the planned procedure without further cardiovascular testing.   Patient was advised that if she develops new symptoms prior to surgery to contact our office to arrange a follow-up appointment.  He verbalized understanding.   Her Plavix may be held for 5 to 7 days prior to her procedure.  Please resume as soon as hemostasis is achieved.  A copy of this note will be routed to requesting surgeon.  Time:   Today, I have spent 6 minutes with the patient with telehealth technology discussing medical history, symptoms, and management plan.  I spent greater than 10 minutes, prior to her phone evaluation, reviewing the patient's past medical history and medications.   Ronney Asters, NP  11/03/2021, 8:12 AM

## 2021-11-16 ENCOUNTER — Ambulatory Visit: Payer: Medicare PPO | Admitting: Orthopaedic Surgery

## 2021-11-17 DIAGNOSIS — K5730 Diverticulosis of large intestine without perforation or abscess without bleeding: Secondary | ICD-10-CM | POA: Diagnosis not present

## 2021-11-17 DIAGNOSIS — Z86010 Personal history of colonic polyps: Secondary | ICD-10-CM | POA: Diagnosis not present

## 2021-11-17 DIAGNOSIS — Z09 Encounter for follow-up examination after completed treatment for conditions other than malignant neoplasm: Secondary | ICD-10-CM | POA: Diagnosis not present

## 2021-11-17 DIAGNOSIS — K648 Other hemorrhoids: Secondary | ICD-10-CM | POA: Diagnosis not present

## 2021-11-17 DIAGNOSIS — D123 Benign neoplasm of transverse colon: Secondary | ICD-10-CM | POA: Diagnosis not present

## 2021-11-19 DIAGNOSIS — D123 Benign neoplasm of transverse colon: Secondary | ICD-10-CM | POA: Diagnosis not present

## 2021-12-07 DIAGNOSIS — L578 Other skin changes due to chronic exposure to nonionizing radiation: Secondary | ICD-10-CM | POA: Diagnosis not present

## 2021-12-07 DIAGNOSIS — L821 Other seborrheic keratosis: Secondary | ICD-10-CM | POA: Diagnosis not present

## 2021-12-07 DIAGNOSIS — D1801 Hemangioma of skin and subcutaneous tissue: Secondary | ICD-10-CM | POA: Diagnosis not present

## 2021-12-07 DIAGNOSIS — D229 Melanocytic nevi, unspecified: Secondary | ICD-10-CM | POA: Diagnosis not present

## 2021-12-07 DIAGNOSIS — L816 Other disorders of diminished melanin formation: Secondary | ICD-10-CM | POA: Diagnosis not present

## 2021-12-07 DIAGNOSIS — L853 Xerosis cutis: Secondary | ICD-10-CM | POA: Diagnosis not present

## 2021-12-07 DIAGNOSIS — L814 Other melanin hyperpigmentation: Secondary | ICD-10-CM | POA: Diagnosis not present

## 2021-12-07 DIAGNOSIS — L570 Actinic keratosis: Secondary | ICD-10-CM | POA: Diagnosis not present

## 2022-01-19 ENCOUNTER — Encounter (INDEPENDENT_AMBULATORY_CARE_PROVIDER_SITE_OTHER): Payer: Self-pay

## 2022-04-06 DIAGNOSIS — E1169 Type 2 diabetes mellitus with other specified complication: Secondary | ICD-10-CM | POA: Diagnosis not present

## 2022-04-06 DIAGNOSIS — F331 Major depressive disorder, recurrent, moderate: Secondary | ICD-10-CM | POA: Diagnosis not present

## 2022-04-06 DIAGNOSIS — I25118 Atherosclerotic heart disease of native coronary artery with other forms of angina pectoris: Secondary | ICD-10-CM | POA: Diagnosis not present

## 2022-04-06 DIAGNOSIS — F321 Major depressive disorder, single episode, moderate: Secondary | ICD-10-CM | POA: Diagnosis not present

## 2022-04-06 DIAGNOSIS — N1831 Chronic kidney disease, stage 3a: Secondary | ICD-10-CM | POA: Diagnosis not present

## 2022-04-06 DIAGNOSIS — E038 Other specified hypothyroidism: Secondary | ICD-10-CM | POA: Diagnosis not present

## 2022-04-14 ENCOUNTER — Ambulatory Visit: Payer: Medicare PPO | Attending: Internal Medicine

## 2022-04-14 ENCOUNTER — Ambulatory Visit: Payer: Medicare PPO | Attending: Internal Medicine | Admitting: Internal Medicine

## 2022-04-14 ENCOUNTER — Encounter: Payer: Self-pay | Admitting: Internal Medicine

## 2022-04-14 VITALS — BP 132/73 | HR 66 | Ht 64.0 in | Wt 180.0 lb

## 2022-04-14 DIAGNOSIS — I10 Essential (primary) hypertension: Secondary | ICD-10-CM | POA: Diagnosis not present

## 2022-04-14 DIAGNOSIS — I493 Ventricular premature depolarization: Secondary | ICD-10-CM

## 2022-04-14 DIAGNOSIS — E119 Type 2 diabetes mellitus without complications: Secondary | ICD-10-CM

## 2022-04-14 DIAGNOSIS — E1169 Type 2 diabetes mellitus with other specified complication: Secondary | ICD-10-CM

## 2022-04-14 DIAGNOSIS — I491 Atrial premature depolarization: Secondary | ICD-10-CM | POA: Diagnosis not present

## 2022-04-14 DIAGNOSIS — E669 Obesity, unspecified: Secondary | ICD-10-CM

## 2022-04-14 DIAGNOSIS — I253 Aneurysm of heart: Secondary | ICD-10-CM | POA: Diagnosis not present

## 2022-04-14 DIAGNOSIS — I5181 Takotsubo syndrome: Secondary | ICD-10-CM | POA: Diagnosis not present

## 2022-04-14 DIAGNOSIS — E1159 Type 2 diabetes mellitus with other circulatory complications: Secondary | ICD-10-CM | POA: Diagnosis not present

## 2022-04-14 DIAGNOSIS — I152 Hypertension secondary to endocrine disorders: Secondary | ICD-10-CM

## 2022-04-14 NOTE — Patient Instructions (Signed)
Medication Instructions:  Your physician recommends that you continue on your current medications as directed. Please refer to the Current Medication list given to you today.  *If you need a refill on your cardiac medications before your next appointment, please call your pharmacy*  Lab Work: NONE  Testing/Procedures: Your physician has requested that you wear a Zio heart monitor for 14 days. This will be mailed to your home with instructions on how to apply the monitor and how to return it when finished. Please allow 2 weeks after returning the heart monitor before our office calls you with the results.   Follow-Up: At [facility], you and your health needs are our priority.  As part of our continuing mission to provide you with exceptional heart care, we have created designated Provider Care Teams.  These Care Teams include your primary Cardiologist (physician) and Advanced Practice Providers (APPs -  Physician Assistants and Nurse Practitioners) who all work together to provide you with the care you need, when you need it.  Your next appointment:   3-4 month(s)  The format for your next appointment:   In Person  Provider:   Werner Lean, MD  Other Instructions ZIO XT- Long Term Monitor Instructions     Your physician has requested you wear a ZIO patch monitor for 14 days.  This is a single patch monitor. [employer] supplies one patch monitor per enrollment. Additional  stickers are not available. Please do not apply patch if you will be having a Nuclear Stress Test,  Echocardiogram, Cardiac CT, MRI, or Chest Xray during the period you would be wearing the  monitor. The patch cannot be worn during these tests. You cannot remove and re-apply the  ZIO XT patch monitor.  Your ZIO patch monitor will be mailed 3 day USPS to your address on file. It may take 3-5 days  to receive your monitor after you have been enrolled.  Once you have received your monitor, please  review the enclosed instructions. Your monitor  has already been registered assigning a specific monitor serial # to you.     Billing and Patient Assistance Program Information     We have supplied [employer] with any of your insurance information on file for billing purposes.  [employer] offers a sliding scale Patient Assistance Program for patients that do not have  insurance, or whose insurance does not completely cover the cost of the ZIO monitor.  You must apply for the Patient Assistance Program to qualify for this discounted rate.  To apply, please call [employer] at [phone], select option 4, select option 2, ask to apply for  Patient Assistance Program. Theodore Demark will ask your household income, and how many people  are in your household. They will quote your out-of-pocket cost based on that information.  [employer] will also be able to set up a 47-month, interest-free payment plan if needed.     Applying the monitor     Shave hair from upper left chest.  Hold abrader disc by orange tab. Rub abrader in 40 strokes over the upper left chest as  indicated in your monitor instructions.  Clean area with 4 enclosed alcohol pads. Let dry.  Apply patch as indicated in monitor instructions. Patch will be placed under collarbone on left  side of chest with arrow pointing upward.  Rub patch adhesive wings for 2 minutes. Remove white label marked "1". Remove the white  label marked "2". Rub patch adhesive wings for 2 additional minutes.  While looking in a mirror, press and release button in center of patch. A small green light will  flash 3-4 times. This will be your only indicator that the monitor has been turned on.  Do not shower for the first 24 hours. You may shower after the first 24 hours.  Press the button if you feel a symptom. You will hear a small click. Record Date, Time and  Symptom in the Patient Logbook.  When you are ready to remove the patch, follow instructions on the last 2 pages  of Patient  Logbook. Stick patch monitor onto the last page of Patient Logbook.  Place Patient Logbook in the blue and white box. Use locking tab on box and tape box closed  securely. The blue and white box has prepaid postage on it. Please place it in the mailbox as  soon as possible. Your physician should have your test results approximately 7 days after the  monitor has been mailed back to [facility].  Call [facility] Customer Care at [phone] if you have questions regarding  your ZIO XT patch monitor. Call them immediately if you see an orange light blinking on your  monitor.  If your monitor falls off in less than 4 days, contact our Monitor department at [phone].  If your monitor becomes loose or falls off after 4 days call [employer] at [phone] for  suggestions on securing your monitor.    Important Information About Sugar

## 2022-04-14 NOTE — Progress Notes (Unsigned)
Enrolled patient for a 14 day Zio XT  monitor to be mailed to patients home  °

## 2022-04-14 NOTE — Progress Notes (Signed)
Cardiology Office Note:    Date:  04/14/2022   ID:  EMONII Rachel Ayers, DOB 03-16-1956, MRN [id number]  PCP:  Rachel Amel, MD  [facility] Cardiologist:  Werner Lean, MD  [place] Electrophysiologist:  None   Referring MD: Rachel Amel, MD   CC: Follow up  History of Present Illness:    Rachel Ayers is a 66 y.o. 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 2021 Had echocardiogram with question of RV dysfunction with CMR showing no RV dysfunction.  Had some evidence of atrial septal aneurysm without evidence of PFO.  2022: Preop eval.  Had mild PAD and plavix. No DVT.  Patient notes that she is doing [place].   She is having more frequent PVCs.  When she has them she has CP and pallor.  There are no interval hospital/ED visit.    No chest pain or pressure .  No SOB/DOE and no PND/Orthopnea.  No weight gain or leg swelling.   Last week was having low blood pressure.    Got back from her [place] trip with no symptoms.  Ambulatory blood pressure not done.   Past Medical History:  Diagnosis Date   Anemia    Anxiety    Asthma    CAD (coronary artery disease)    Chest pain    Constipation    Depression    DM (diabetes mellitus) (HCC)    Estrogen deficiency    Fatty liver    GERD (gastroesophageal reflux disease)    HTN (hypertension)    Hypercholesteremia    Kidney problem    MI (myocardial infarction) ([place])    Morbid obesity (HCC)    OSA (obstructive sleep apnea)    Palpitations    Vitamin D deficiency    Past Surgical History:  Procedure Laterality Date   left hip replacement      Current Medications: Current Meds  Medication Sig   buPROPion (WELLBUTRIN SR) 200 MG 12 hr tablet Take 200 mg by  mouth daily.   cholecalciferol (VITAMIN D3) 25 MCG (1000 UNIT) tablet Take 1,000 Units by mouth daily.   clopidogrel (PLAVIX) 75 MG tablet Take 1 tablet (75 mg total) by mouth daily.   escitalopram (LEXAPRO) 20 MG tablet Take 20 mg by mouth daily.   lansoprazole (PREVACID) 15 MG capsule Take 15 mg by mouth daily at 12 noon.   loratadine (CLARITIN) 10 MG tablet Take 10 mg by mouth daily.   METOPROLOL SUCCINATE ER PO Take 25 mg by mouth daily.    mometasone (NASONEX) 50 MCG/ACT nasal spray as needed (allergies).   Multiple Vitamins-Minerals (MULTIPLE VITAMINS/WOMENS PO) Take by mouth daily.   olmesartan (BENICAR) 20 MG tablet Take 10 mg by mouth daily.   OZEMPIC, 1 MG/DOSE, 4 MG/3ML SOPN Inject into the skin once a week.   simvastatin (ZOCOR) 40 MG tablet Take 40 mg by mouth daily.   SYMBICORT 160-4.5 MCG/ACT inhaler Inhale into the lungs daily at 6 (six) AM.    Allergies:   Sulfa antibiotics   Social History   Socioeconomic History   Marital status: Divorced    Spouse name: Not on file   Number of children: Not on file   Years of education: Not on file   Highest education  level: Not on file  Occupational History   Occupation: Retired Metallurgist  Tobacco Use   Smoking status: Never   Smokeless tobacco: Never  Substance and Sexual Activity   Alcohol use: Never   Drug use: Never   Sexual activity: Never  Other Topics Concern   Not on file  Social History Narrative   Not on file   Social Determinants of Health   Financial Resource Strain: Not on file  Food Insecurity: Not on file  Transportation Needs: Not on file  Physical Activity: Not on file  Stress: Not on file  Social Connections: Not on file    Social: did coronation; did [place] 2023 October  Family History: The patient's family history includes Cancer in her mother; Depression in her mother; Diabetes in her father; Heart disease in her father; High Cholesterol in her mother; High blood pressure in her father and  mother; Obesity in her father. Sister had Mi at age 14 (also had tobacco use) Father had heart disease NOS.  ROS:   Please see the history of present illness.    All other systems reviewed and are negative.  EKGs/Labs/Other Studies Reviewed:    The following studies were reviewed today:  EKG:   04/14/22: SR rate 66 septal infarct pattern. 07/27/21: Sinus bradycardia rate 54 02/26/20: Sinus rhythm rate of 60 normal axis, q wave in V1, borderline criteria for anterior infarct  Cardiac Event Monitoring- OSH Dr. Carlena Bjornstad: Results: PVC and PACs on last Holter Monitor  Transthoracic Echocardiogram: Date: 03/09/20 Results:  1. Left ventricular ejection fraction, by estimation, is 65 to 70%. The  left ventricle has normal function. The left ventricle has no regional  wall motion abnormalities. There is mild left ventricular hypertrophy.  Left ventricular diastolic parameters  are indeterminate.   2. Endocardiurm of RV very difficult to see. Definity did not help. RV  function appears reduced . Right ventricular systolic function was not  well visualized. The right ventricular size is not well visualized.   3. The mitral valve is normal in structure. Trivial mitral valve  regurgitation.   4. The aortic valve is abnormal. Aortic valve regurgitation is not  visualized. Mild aortic valve sclerosis is present, with no evidence of  aortic valve stenosis.   5. The inferior vena cava is normal in size with greater than 50%  respiratory variability, suggesting right atrial pressure of 3 mmHg.   Cardiac MR: Date: 03/31/20 Results: FINDINGS: 1. Normal left ventricular size, LVEDVI 38 ml/m2. Mild, concentric LV thickness with normal LV Mass (Myo mass/BSA 59 g/m2). Hyperdynamic LV systolic function (LVEF =68%). There are no regional wall motion abnormalities.  There is no late gadolinium enhancement in the left ventricular myocardium.  2. Normal right ventricular size and thickness, RVEDVI 46  ml/m2. Normal RV systolic function (RVEF =03%). There are no regional wall motion abnormalities or aneurysms. No criteria for ARVC met.  3. Normal left and right atrial size. Evidence of atrial septal aneurysm.  4. Normal size of the aortic root, ascending aorta and pulmonary artery.  5.  No significant valvular abnormalities.  6.  Normal pericardium.  No pericardial effusion.   IMPRESSION: Normal biventricular function without evidence of late gadolinium enhancement.  No criteria for ARVC met.  Left/Right Heart Catheterizations: Date: 03/22/09 Results: OSH LCP:03/22/09- EF 40% LVEDP 18, Nondominant Circ; Large RCA (dominant vessel) widely patent coronary arteries    ECHO COMPLETE WITH IMAGING ENHANCING AGENT 03/09/2020  Narrative ECHOCARDIOGRAM REPORT    Patient Name:  Faye Ramsay Date of Exam: 03/09/2020 Medical Rec #:  [id number]           Height:       64.0 in Accession #:    [id number]          Weight:       192.8 lb Date of Birth:  December 20, 1955           BSA:          1.926 m Patient Age:    54 years            BP:           130/80 mmHg Patient Gender: F                   HR:           69 bpm. Exam Location:  [place]  Procedure: 2D Echo, Cardiac Doppler, Color Doppler and Intracardiac Opacification Agent  Indications:    I42.9 Cardiomyopathy (unspecified); I51.81 Takotsubo syndrome  History:        Patient has no prior history of Echocardiogram examinations. Previous Myocardial Infarction and CAD, Arrythmias:PVC and PAC, Signs/Symptoms:Chest Pain; Risk Factors:Diabetes. Obesity. Obstructive sleep apnea.  Sonographer:    Diamond Nickel RCS Referring Phys: [id number] [facility] A Kaity Pitstick  IMPRESSIONS   1. Left ventricular ejection fraction, by estimation, is 65 to 70%. The left ventricle has normal function. The left ventricle has no regional wall motion abnormalities. There is mild left ventricular hypertrophy. Left ventricular diastolic  parameters are indeterminate. 2. Endocardiurm of RV very difficult to see. Definity did not help. RV function appears reduced . Right ventricular systolic function was not well visualized. The right ventricular size is not well visualized. 3. The mitral valve is normal in structure. Trivial mitral valve regurgitation. 4. The aortic valve is abnormal. Aortic valve regurgitation is not visualized. Mild aortic valve sclerosis is present, with no evidence of aortic valve stenosis. 5. The inferior vena cava is normal in size with greater than 50% respiratory variability, suggesting right atrial pressure of 3 mmHg.  FINDINGS Left Ventricle: Left ventricular ejection fraction, by estimation, is 65 to 70%. The left ventricle has normal function. The left ventricle has no regional wall motion abnormalities. The left ventricular internal cavity size was normal in size. There is mild left ventricular hypertrophy. Left ventricular diastolic parameters are indeterminate.  Right Ventricle: Endocardiurm of RV very difficult to see. Definity did not help. RV function appears reduced. The right ventricular size is not well visualized. No increase in right ventricular wall thickness. Right ventricular systolic function was not well visualized.  Left Atrium: Left atrial size was normal in size.  Right Atrium: Right atrial size was normal in size.  Pericardium: There is no evidence of pericardial effusion.  Mitral Valve: The mitral valve is normal in structure. Trivial mitral valve regurgitation.  Tricuspid Valve: The tricuspid valve is normal in structure. Tricuspid valve regurgitation is trivial.  Aortic Valve: The aortic valve is abnormal. Aortic valve regurgitation is not visualized. Mild aortic valve sclerosis is present, with no evidence of aortic valve stenosis.  Pulmonic Valve: The pulmonic valve was normal in structure. Pulmonic valve regurgitation is trivial.  Aorta: The aortic root and ascending  aorta are structurally normal, with no evidence of dilitation.  Venous: The inferior vena cava is normal in size with greater than 50% respiratory variability, suggesting right atrial pressure of 3 mmHg.  IAS/Shunts: No atrial level shunt detected by color flow Doppler.  LEFT VENTRICLE PLAX 2D LVIDd:         3.10 cm  Diastology LVIDs:         1.60 cm  LV e' medial:    4.14 cm/s LV PW:         1.70 cm  LV E/e' medial:  20.6 LV IVS:        1.80 cm  LV e' lateral:   5.11 cm/s LVOT diam:     1.80 cm  LV E/e' lateral: 16.7 LV SV:         63 LV SV Index:   33 LVOT Area:     2.54 cm   RIGHT VENTRICLE RV Basal diam:  3.20 cm RV S prime:     16.30 cm/s TAPSE (M-mode): 2.0 cm RVSP:           19.2 mmHg  LEFT ATRIUM             Index       RIGHT ATRIUM           Index LA diam:        4.00 cm 2.08 cm/m  RA Pressure: 3.00 mmHg LA Vol (A2C):   29.2 ml 15.16 ml/m RA Area:     16.00 cm LA Vol (A4C):   30.8 ml 15.99 ml/m RA Volume:   42.20 ml  21.91 ml/m LA Biplane Vol: 31.5 ml 16.35 ml/m AORTIC VALVE LVOT Vmax:   107.00 cm/s LVOT Vmean:  76.400 cm/s LVOT VTI:    0.246 m  AORTA Ao Root diam: 2.90 cm  MITRAL VALVE                TRICUSPID VALVE MV Area (PHT): 2.69 cm     TR Peak grad:   16.2 mmHg MV Decel Time: 282 msec     TR Vmax:        201.00 cm/s MV E velocity: 85.30 cm/s   Estimated RAP:  3.00 mmHg MV A velocity: 130.00 cm/s  RVSP:           19.2 mmHg MV E/A ratio:  0.66 SHUNTS Systemic VTI:  0.25 m Systemic Diam: 1.80 cm  Dorris Carnes MD Electronically signed by Dorris Carnes MD Signature Date/Time: 03/09/2020/4:51:37 PM   Recent Labs: No results found for requested labs within last 365 days.  Recent Lipid Panel    Component Value Date/Time   CHOL 184 02/22/2021 0000   TRIG 161 (H) 02/22/2021 0000   HDL 50 02/22/2021 0000   LDLCALC 106 (H) 02/22/2021 0000   Physical Exam:    VS:  BP 132/73   Pulse 66   Ht _0  (1.626 m)   Wt 180 lb (81.6 kg)   SpO2 97%    BMI 30.90 kg/m     Wt Readings from Last 3 Encounters:  04/14/22 180 lb (81.6 kg)  07/27/21 185 lb (83.9 kg)  04/22/21 185 lb (83.9 kg)    Gen: No distress   Neck: No JVD Cardiac: No Rubs or Gallops, no murmur, regular bradycardia, +2 radial pulses Respiratory: Clear to auscultation bilaterally, normal effort, normal  respiratory rate GI: Soft, nontender, non-distended  MS: No  edema;  moves all extremities Integument: Skin feels warm Neuro:  At time of evaluation, alert and oriented to person/place/time/situation  Psych: Normal affect, patient feels well  ASSESSMENT:    1. PVC (premature ventricular contraction)   2. Takotsubo cardiomyopathy   3. Diabetes mellitus with coincident hypertension ([place])   4. Obesity,  diabetes, and hypertension syndrome ([place])   5. PAC (premature atrial contraction)   6. Atrial septal aneurysm     PLAN:    History of Takostubo Cardiomyopathy PVCs and PACs HTN and DM OSA on CPAP With recent hypotension - Will repeat two week non live ziopatch - if recurrent low BP would cut ARB - if frequent of symptomatic PVCs I have consented for Exercise NM Stress test prior to 1C start - low threshold for EP f/u   Mild PAD Hyperlipidemia (Mixed) Aortic Sclerosis - LDL goal less than 70 -continue current statin; she is not amenable to increase at this time - on low dose plavix, we discussed low NNT and NNH for this approach  Atrial Septal Aneurysm - discussed PFO risk; monitor   Medication Adjustments/Labs and Tests Ordered: Current medicines are reviewed at length with the patient today.  Concerns regarding medicines are outlined above.  Orders Placed This Encounter  Procedures   Cardiac Stress Test: Informed Consent Details: Physician/Practitioner Attestation; Transcribe to consent form and obtain patient signature   LONG TERM MONITOR (3-14 DAYS)   EKG 12-Lead   No orders of the defined types were placed in this encounter.   Patient  Instructions  Medication Instructions:  Your physician recommends that you continue on your current medications as directed. Please refer to the Current Medication list given to you today.  *If you need a refill on your cardiac medications before your next appointment, please call your pharmacy*  Lab Work: NONE  Testing/Procedures: Your physician has requested that you wear a Zio heart monitor for 14 days. This will be mailed to your home with instructions on how to apply the monitor and how to return it when finished. Please allow 2 weeks after returning the heart monitor before our office calls you with the results.   Follow-Up: At [facility], you and your health needs are our priority.  As part of our continuing mission to provide you with exceptional heart care, we have created designated Provider Care Teams.  These Care Teams include your primary Cardiologist (physician) and Advanced Practice Providers (APPs -  Physician Assistants and Nurse Practitioners) who all work together to provide you with the care you need, when you need it.  Your next appointment:   3-4 month(s)  The format for your next appointment:   In Person  Provider:   Werner Lean, MD  Other Instructions ZIO XT- Long Term Monitor Instructions     Your physician has requested you wear a ZIO patch monitor for 14 days.  This is a single patch monitor. [employer] supplies one patch monitor per enrollment. Additional  stickers are not available. Please do not apply patch if you will be having a Nuclear Stress Test,  Echocardiogram, Cardiac CT, MRI, or Chest Xray during the period you would be wearing the  monitor. The patch cannot be worn during these tests. You cannot remove and re-apply the  ZIO XT patch monitor.  Your ZIO patch monitor will be mailed 3 day USPS to your address on file. It may take 3-5 days  to receive your monitor after you have been enrolled.  Once you have received your  monitor, please review the enclosed instructions. Your monitor  has already been registered assigning a specific monitor serial # to you.     Billing and Patient Assistance Program Information     We have supplied [employer] with any of your insurance information on file for billing purposes.  [employer]  offers a sliding scale Patient Assistance Program for patients that do not have  insurance, or whose insurance does not completely cover the cost of the ZIO monitor.  You must apply for the Patient Assistance Program to qualify for this discounted rate.  To apply, please call [employer] at [phone], select option 4, select option 2, ask to apply for  Patient Assistance Program. Theodore Demark will ask your household income, and how many people  are in your household. They will quote your out-of-pocket cost based on that information.  [employer] will also be able to set up a 83-month interest-free payment plan if needed.     Applying the monitor     Shave hair from upper left chest.  Hold abrader disc by orange tab. Rub abrader in 40 strokes over the upper left chest as  indicated in your monitor instructions.  Clean area with 4 enclosed alcohol pads. Let dry.  Apply patch as indicated in monitor instructions. Patch will be placed under collarbone on left  side of chest with arrow pointing upward.  Rub patch adhesive wings for 2 minutes. Remove white label marked "1". Remove the white  label marked "2". Rub patch adhesive wings for 2 additional minutes.  While looking in a mirror, press and release button in center of patch. A small green light will  flash 3-4 times. This will be your only indicator that the monitor has been turned on.  Do not shower for the first 24 hours. You may shower after the first 24 hours.  Press the button if you feel a symptom. You will hear a small click. Record Date, Time and  Symptom in the Patient Logbook.  When you are ready to remove the patch, follow instructions on  the last 2 pages of Patient  Logbook. Stick patch monitor onto the last page of Patient Logbook.  Place Patient Logbook in the blue and white box. Use locking tab on box and tape box closed  securely. The blue and white box has prepaid postage on it. Please place it in the mailbox as  soon as possible. Your physician should have your test results approximately 7 days after the  monitor [facility]  Call [place]at [phone] you have questions regarding  your ZIO XT patch monitor. Call them immediately if you see an orange light blinking on your  monitor.  If your monitor falls off in less than 4 days, contact our Monitor department at [phone]  If your monitor becomes loose or falls off after 4 days call [employer] at [phone]  suggestions on securing your monitor.    Important Information About Sugar         Signed, MWerner Lean MD  04/14/2022 10:13 AM    [facility]

## 2022-04-16 DIAGNOSIS — I493 Ventricular premature depolarization: Secondary | ICD-10-CM

## 2022-05-10 ENCOUNTER — Encounter (HOSPITAL_BASED_OUTPATIENT_CLINIC_OR_DEPARTMENT_OTHER): Payer: Self-pay

## 2022-05-10 ENCOUNTER — Emergency Department (HOSPITAL_BASED_OUTPATIENT_CLINIC_OR_DEPARTMENT_OTHER): Payer: Medicare PPO | Admitting: Radiology

## 2022-05-10 ENCOUNTER — Emergency Department (HOSPITAL_BASED_OUTPATIENT_CLINIC_OR_DEPARTMENT_OTHER)
Admission: EM | Admit: 2022-05-10 | Discharge: 2022-05-10 | Discharge status: Against Medical Advice | Payer: Medicare PPO | Attending: Emergency Medicine | Admitting: Emergency Medicine

## 2022-05-10 ENCOUNTER — Encounter: Payer: Self-pay | Admitting: Internal Medicine

## 2022-05-10 ENCOUNTER — Other Ambulatory Visit: Payer: Self-pay

## 2022-05-10 DIAGNOSIS — Z5321 Procedure and treatment not carried out due to patient leaving prior to being seen by health care provider: Secondary | ICD-10-CM | POA: Insufficient documentation

## 2022-05-10 DIAGNOSIS — R079 Chest pain, unspecified: Secondary | ICD-10-CM | POA: Diagnosis not present

## 2022-05-10 DIAGNOSIS — I493 Ventricular premature depolarization: Secondary | ICD-10-CM

## 2022-05-10 DIAGNOSIS — R42 Dizziness and giddiness: Secondary | ICD-10-CM | POA: Insufficient documentation

## 2022-05-10 LAB — TROPONIN I (HIGH SENSITIVITY): Troponin I (High Sensitivity): 5 ng/L (ref <18)

## 2022-05-10 LAB — CBC
HCT: 36.3 % (ref 36.0–46.0)
Hemoglobin: 12.1 g/dL (ref 12.0–15.0)
MCH: 30.6 pg (ref 26.0–34.0)
MCHC: 33.3 g/dL (ref 30.0–36.0)
MCV: 91.9 fL (ref 80.0–100.0)
Platelets: 280 10*3/uL (ref 150–400)
RBC: 3.95 MIL/uL (ref 3.87–5.11)
RDW: 11.7 % (ref 11.5–15.5)
WBC: 6.2 10*3/uL (ref 4.0–10.5)
nRBC: 0 % (ref 0.0–0.2)

## 2022-05-10 LAB — BASIC METABOLIC PANEL
Anion gap: 8 (ref 5–15)
BUN: 15 mg/dL (ref 8–23)
CO2: 30 mmol/L (ref 22–32)
Calcium: 9.2 mg/dL (ref 8.9–10.3)
Chloride: 101 mmol/L (ref 98–111)
Creatinine, Ser: 1.37 mg/dL — ABNORMAL HIGH (ref 0.44–1.00)
GFR, Estimated: 42 mL/min — ABNORMAL LOW (ref >60)
Glucose, Bld: 83 mg/dL (ref 70–99)
Potassium: 4.8 mmol/L (ref 3.5–5.1)
Sodium: 139 mmol/L (ref 135–145)

## 2022-05-10 NOTE — ED Triage Notes (Signed)
Patient here POV from Home.  Endorses CP that began approximately 4-5 Days ago. Recently wore a heart monitor for Monitoring of a "Fluttering" Feeling and PVCs were noted. Associated with Dizziness.   NAD Noted during Triage. A&Ox4. GCS 15. BIB Wheelchair.

## 2022-05-10 NOTE — Addendum Note (Signed)
Addended by: Alvin Critchley A on: 05/10/2022 04:19 PM   Modules accepted: Orders

## 2022-05-10 NOTE — Telephone Encounter (Signed)
STAT if patient feels like he/she is going to faint   Are you dizzy now? Yes   Do you feel faint or have you passed out? No   Do you have any other symptoms? SOB   Have you checked your HR and BP (record if available)? No does not have BP cuff  Patient if following up regarding pt message. She states PVC's have worsened and become more frequent since Friday. They are most common while active, but still occurring while at rest. Sitting when they occur does not help symptoms subside.

## 2022-05-10 NOTE — Telephone Encounter (Signed)
Spoke with patient who states since she finished wearing the cardiac monitor her palpitations have worsened. She is unable to perform any activity without triggering an event and she become SOB. She wanted to make Dr Izora Ribas aware in case she need to wear another monitor or should she come in to be seen.   She was offered an appointment but declined until she hears back from Dr Izora Ribas. Advised if symptoms worsen, she develops chest pain or other associated symptoms she needs to go to the ED for evaluation. Patient verbalized understanding.

## 2022-05-11 ENCOUNTER — Telehealth (HOSPITAL_COMMUNITY): Payer: Self-pay | Admitting: *Deleted

## 2022-05-11 NOTE — Telephone Encounter (Signed)
Left message on voicemail per DPR in reference to upcoming appointment scheduled on 05/17/2022 at 10:30 with detailed instructions given per Myocardial Perfusion Study Information Sheet for the test. LM to arrive 15 minutes early, and that it is imperative to arrive on time for appointment to keep from having the test rescheduled. If you need to cancel or reschedule your appointment, please call the office within 24 hours of your appointment. Failure to do so may result in a cancellation of your appointment, and a $50 no show fee. Phone number given for call back for any questions.

## 2022-05-12 ENCOUNTER — Telehealth: Payer: Self-pay | Admitting: Internal Medicine

## 2022-05-12 DIAGNOSIS — I493 Ventricular premature depolarization: Secondary | ICD-10-CM | POA: Diagnosis not present

## 2022-05-12 NOTE — Telephone Encounter (Signed)
Pt calling back after mychart message. She asked that her stress test be cancelled because she does not want to have it done. She states she had this done before with the medications and cannot do it again. Requesting a c/b on what other test Dr. Izora Ribas can order

## 2022-05-13 ENCOUNTER — Telehealth: Payer: Self-pay

## 2022-05-13 DIAGNOSIS — E119 Type 2 diabetes mellitus without complications: Secondary | ICD-10-CM | POA: Diagnosis not present

## 2022-05-13 DIAGNOSIS — H2513 Age-related nuclear cataract, bilateral: Secondary | ICD-10-CM | POA: Diagnosis not present

## 2022-05-13 DIAGNOSIS — H5203 Hypermetropia, bilateral: Secondary | ICD-10-CM | POA: Diagnosis not present

## 2022-05-13 NOTE — Telephone Encounter (Signed)
        Patient  visited [facility] on 11/28    Telephone encounter attempt :  1st  A HIPAA compliant voice message was left requesting a return call.  Instructed patient to call back    Jian Hodgman [facility], [place], Care Management  [phone] [address], [place], [place] [address] Phone: [phone] Email: [email or web address]       

## 2022-05-13 NOTE — Telephone Encounter (Signed)
Called pt advised of MD recommendations.  Pt expresses understanding that referral for EP is placed and a scheduler will call to set up appointment.  Advised to call with questions or concerns.

## 2022-05-16 ENCOUNTER — Telehealth: Payer: Self-pay

## 2022-05-16 NOTE — Telephone Encounter (Signed)
     Patient  visit on 11/28  at [facility]    Have you been able to follow up with your primary care physician? Yes   The patient was or was not able to obtain any needed medicine or equipment. Yes   Are there diet recommendations that you are having difficulty following? Yes   Patient expresses understanding of discharge instructions and education provided has no other needs at this time.  Yes      Lenard Forth [facility] Guide, [facility], Care Management  [phone] [address] [place], [place], [place] [address] Phone: [phone] Email: [email or web address]

## 2022-05-17 ENCOUNTER — Telehealth: Payer: Self-pay | Admitting: Internal Medicine

## 2022-05-17 ENCOUNTER — Encounter (HOSPITAL_COMMUNITY): Payer: Medicare PPO

## 2022-05-17 ENCOUNTER — Encounter: Payer: Self-pay | Admitting: Internal Medicine

## 2022-05-17 MED ORDER — METOPROLOL SUCCINATE ER 50 MG PO TB24: 50.0000 mg | ORAL_TABLET | Freq: Every day | ORAL | 3 refills | Status: DC

## 2022-05-17 NOTE — Telephone Encounter (Signed)
Pt reports "non stop palpitations", says "they are getting worse". States that with any activity the palpitations start up, she doesn't know whether it is SVT or PVCs that are the culprit. She is SOB with any activity, dizzy/light headed and heart palpitations. Currently prescribed Toprol 25 mg daily.  States she is  taking an extra 1/2 tablet daily for the past week/two. States BP at home are usually normal, sometimes low but she isn't checking them regularly. She is not scheduled for EP consult until end of this month, but states she cannot wait that long, something needs to be done now. Aware I am forwarding this to Dr. Izora Ribas for advisement, pt agreeable to plan.

## 2022-05-17 NOTE — Telephone Encounter (Signed)
Called patient with Dr. Debby Bud recommendations as listed below. Clarified that patient will increase her metoprolol to 50 mg. Did not call in new dose. Patient stated she would let our office know when she needs refills. Patient will call back if her symptoms continue with the increased dose.  Christell Constant, MD  Baird Lyons, RN; [facility] [address] Triage; [place], Alena Bills N, RN44 minutes ago (10:10 AM)   MC Let's increase the beta blocker dose.  She did not want to pursue stress testing; given her CKD I did not do CCTA, given her other risk factors, I would wanted to get testing before starting her on 1C agent.

## 2022-05-17 NOTE — Telephone Encounter (Signed)
Pt c/o medication issue:  1. Name of Medication: Metoprolol Succinate 25mg    2. How are you currently taking this medication (dosage and times per day)?   3. Are you having a reaction (difficulty breathing--STAT)?   4. What is your medication issue? Patient states she has appt with EP doctor on 12/28, she wants to talk to nurse about increase the metoprolol or staring the flecainide until her appt since the appt is so far away and she is having so many palpitations.

## 2022-05-17 NOTE — Telephone Encounter (Signed)
error 

## 2022-05-20 ENCOUNTER — Ambulatory Visit: Payer: Medicare PPO | Attending: Interventional Cardiology | Admitting: Interventional Cardiology

## 2022-05-20 ENCOUNTER — Encounter: Payer: Self-pay | Admitting: Interventional Cardiology

## 2022-05-20 VITALS — BP 148/74 | HR 74 | Ht 64.0 in | Wt 188.4 lb

## 2022-05-20 DIAGNOSIS — I493 Ventricular premature depolarization: Secondary | ICD-10-CM | POA: Diagnosis not present

## 2022-05-20 DIAGNOSIS — I471 Supraventricular tachycardia, unspecified: Secondary | ICD-10-CM | POA: Diagnosis not present

## 2022-05-20 DIAGNOSIS — R42 Dizziness and giddiness: Secondary | ICD-10-CM | POA: Diagnosis not present

## 2022-05-20 DIAGNOSIS — R030 Elevated blood-pressure reading, without diagnosis of hypertension: Secondary | ICD-10-CM | POA: Diagnosis not present

## 2022-05-20 NOTE — Patient Instructions (Addendum)
Medication Instructions:  Your physician recommends that you continue on your current medications as directed. Please refer to the Current Medication list given to you today.  *If you need a refill on your cardiac medications before your next appointment, please call your pharmacy*  Lab Work: If you have labs (blood work) drawn today and your tests are completely normal, you will receive your results only by: MyChart Message (if you have MyChart) OR A paper copy in the mail If you have any lab test that is abnormal or we need to change your treatment, we will call you to review the results.  Testing/Procedures: None ordered today.  Follow-Up: At [facility], you and your health needs are our priority.  As part of our continuing mission to provide you with exceptional heart care, we have created designated Provider Care Teams.  These Care Teams include your primary Cardiologist (physician) and Advanced Practice Providers (APPs -  Physician Assistants and Nurse Practitioners) who all work together to provide you with the care you need, when you need it.  We recommend signing up for the patient portal called "MyChart".  Sign up information is provided on this After Visit Summary.  MyChart is used to connect with patients for Virtual Visits (Telemedicine).  Patients are able to view lab/test results, encounter notes, upcoming appointments, etc.  Non-urgent messages can be sent to your provider as well.   To learn more about what you can do with MyChart, go to [email or web address].    Your next appointment:   On 06/09/22 with Dr. Nelly Laurence  Important Information About Sugar

## 2022-05-20 NOTE — Progress Notes (Signed)
Cardiology Office Note   Date:  05/20/2022   ID:  Rachel Ayers, DOB 08/20/1955, MRN [id number]  PCP:  Darrow Bussing, MD    No chief complaint on file.    Wt Readings from Last 3 Encounters:  05/20/22 188 lb 6.4 oz (85.5 kg)  05/10/22 179 lb 14.3 oz (81.6 kg)  04/14/22 180 lb (81.6 kg)       History of Present Illness: Rachel Ayers is a 66 y.o. female who is being seen today for the evaluation of palpitations at the request of Koirala, Dibas, MD.   Has seen Dr. Izora Ribas.  Seen today as a DOD visit.  She  Wore a monitor in 11/23: "Two monitors used.   Patient had a minimum heart rate of 56 bpm, maximum heart rate of 160 bpm, and average heart rate of 73 bpm.   Predominant underlying rhythm was sinus.   Small paroxysms of SVT that are symptomatic.   Isolated PACs were rare (<1.0%).   Isolated PVCs were rare (<1.0%).   Triggered and diary events associated with sinus rhythm, PACs, PVCs, and SVT.   Rare, symptomatic SVT."  Sx were mild during the time she was wearing the monitor.  Sx increased drastically immediately after turning in the monitor.  Metoprolol succinate was increased to 50 mg about 3 days ago.  Some improvement in symptoms but also noted fatigue as well.  Symptoms not completely gone.  She has "done nothing in two days."  Past Medical History:  Diagnosis Date   Anemia    Anxiety    Asthma    CAD (coronary artery disease)    Chest pain    Constipation    Depression    DM (diabetes mellitus) (HCC)    Estrogen deficiency    Fatty liver    GERD (gastroesophageal reflux disease)    HTN (hypertension)    Hypercholesteremia    Kidney problem    MI (myocardial infarction) (HCC)    Morbid obesity (HCC)    OSA (obstructive sleep apnea)    Palpitations    Vitamin D deficiency     Past Surgical History:  Procedure Laterality Date   left hip replacement       Current Outpatient Medications  Medication Sig Dispense Refill   buPROPion  (WELLBUTRIN SR) 200 MG 12 hr tablet Take 200 mg by mouth daily.     clopidogrel (PLAVIX) 75 MG tablet Take 1 tablet (75 mg total) by mouth daily. 90 tablet 3   escitalopram (LEXAPRO) 20 MG tablet Take 20 mg by mouth daily.     lansoprazole (PREVACID) 15 MG capsule Take 15 mg by mouth daily at 12 noon.     loratadine (CLARITIN) 10 MG tablet Take 10 mg by mouth daily.     metoprolol succinate (TOPROL-XL) 50 MG 24 hr tablet Take 1 tablet (50 mg total) by mouth daily. 90 tablet 3   mometasone (NASONEX) 50 MCG/ACT nasal spray as needed (allergies).     Multiple Vitamins-Minerals (MULTIPLE VITAMINS/WOMENS PO) Take by mouth daily.     olmesartan (BENICAR) 20 MG tablet Take 10 mg by mouth daily.     OZEMPIC, 1 MG/DOSE, 4 MG/3ML SOPN Inject into the skin once a week.     simvastatin (ZOCOR) 40 MG tablet Take 40 mg by mouth daily.     SYMBICORT 160-4.5 MCG/ACT inhaler Inhale into the lungs daily at 6 (six) AM.     cholecalciferol (VITAMIN D3) 25 MCG (1000 UNIT) tablet  Take 1,000 Units by mouth daily. (Patient not taking: Reported on 05/20/2022)     No current facility-administered medications for this visit.    Allergies:   Sulfa antibiotics    Social History:  The patient  reports that she has never smoked. She has never used smokeless tobacco. She reports that she does not drink alcohol and does not use drugs.   Family History:  The patient's family history includes Cancer in her mother; Depression in her mother; Diabetes in her father; Heart disease in her father; High Cholesterol in her mother; High blood pressure in her father and mother; Obesity in her father.    ROS:  Please see the history of present illness.   Otherwise, review of systems are positive for palpitations.   All other systems are reviewed and negative.    PHYSICAL EXAM: VS:  BP (!) 170/88   Pulse 74   Ht 5\' 4"  (1.626 m)   Wt 188 lb 6.4 oz (85.5 kg)   SpO2 96%   BMI 32.34 kg/m  , BMI Body mass index is 32.34 kg/m. GEN:  Well nourished, well developed, in no acute distress HEENT: normal Neck: no JVD, carotid bruits, or masses Cardiac: RRR; no murmurs, rubs, or gallops,no edema  Respiratory:  clear to auscultation bilaterally, normal work of breathing GI: soft, nontender, nondistended, + BS MS: no deformity or atrophy Skin: warm and dry, no rash Neuro:  Strength and sensation are intact Psych: euthymic mood, full affect    Recent Labs: 05/10/2022: BUN 15; Creatinine, Ser 1.37; Hemoglobin 12.1; Platelets 280; Potassium 4.8; Sodium 139   Lipid Panel    Component Value Date/Time   CHOL 184 02/22/2021 0000   TRIG 161 (H) 02/22/2021 0000   HDL 50 02/22/2021 0000   LDLCALC 106 (H) 02/22/2021 0000     Other studies Reviewed: Additional studies/ records that were reviewed today with results demonstrating: monitor reviewed.   ASSESSMENT AND PLAN:  SVT: Discussed options including antiarrhythmic,  Flecainide therapy.  We discussed the possibility of increasing metoprolol dose.   she has scheduled consultation with our electrophysiology doctors for possible ablation and other options.  For now, we will keep medications the same.  Had some improvement. Dizziness: Stay Hydrated.  May take a few more days to get used to her medicine.  No syncope. PVCs: some sx with these beats.  Elevated blood pressure: repeat 148/74.  Check the blood pressure 2-3 times a week at home; let 04/24/2021 know if readings are high.  It has been in the 120's last time she checked.    Current medicines are reviewed at length with the patient today.  The patient concerns regarding her medicines were addressed.  The following changes have been made:  No change  Labs/ tests ordered today include:  No orders of the defined types were placed in this encounter.   Recommend 150 minutes/week of aerobic exercise Low fat, low carb, high fiber diet recommended  Disposition:   FU in as scheduled with electrophysiology   Signed, [place], MD  05/20/2022 11:43 AM    [facility] [address] [place], [place], [place]  [place] Phone: [phone]; Fax: [phone]

## 2022-05-30 ENCOUNTER — Other Ambulatory Visit: Payer: Self-pay

## 2022-05-30 MED ORDER — METOPROLOL SUCCINATE ER 50 MG PO TB24: 50.0000 mg | ORAL_TABLET | Freq: Every day | ORAL | 3 refills | Status: AC

## 2022-05-30 NOTE — Progress Notes (Signed)
Pt request to have metoprolol succinate 50 mg PO QD sent to [facility] on [address] per pt my chart request.

## 2022-05-31 ENCOUNTER — Encounter: Payer: Self-pay | Admitting: Cardiovascular Disease

## 2022-05-31 ENCOUNTER — Ambulatory Visit: Payer: Medicare PPO | Attending: Cardiovascular Disease | Admitting: Cardiovascular Disease

## 2022-05-31 VITALS — BP 110/80 | HR 70 | Ht 64.0 in | Wt 191.2 lb

## 2022-05-31 DIAGNOSIS — I493 Ventricular premature depolarization: Secondary | ICD-10-CM | POA: Diagnosis not present

## 2022-05-31 NOTE — Patient Instructions (Signed)
Medication Instructions:  Your physician recommends that you continue on your current medications as directed. Please refer to the Current Medication list given to you today.  *If you need a refill on your cardiac medications before your next appointment, please call your pharmacy*  Follow-Up: At [facility], you and your health needs are our priority.  As part of our continuing mission to provide you with exceptional heart care, we have created designated Provider Care Teams.  These Care Teams include your primary Cardiologist (physician) and Advanced Practice Providers (APPs -  Physician Assistants and Nurse Practitioners) who all work together to provide you with the care you need, when you need it.  Your next appointment:   As needed with Dr. Nelly Laurence  Important Information About Sugar

## 2022-05-31 NOTE — Progress Notes (Signed)
Electrophysiology Office Note:    Date:  05/31/2022   ID:  Rachel Ayers, DOB 1956-05-13, MRN [id number]  PCP:  Darrow Bussing, MD   [facility] Providers Cardiologist:  Christell Constant, MD     Referring MD: Riley Lam A*   History of Present Illness:    Rachel Ayers is a 66 y.o. female with a hx listed below, significant for PVCs and HTN, referred for arrhythmia management.  She has a history of palpitations dating back decades since she gave birth to her first child.  Since then, the palpitations have come and gone.  They may be prominent for a few years, then disappear for several years.  She wore a ZIO monitor.  It showed a heart rate range of 57 to 100 bpm with an average of 72.  There were a few brief atrial runs, the longest was 5 beats.  There were many patient triggered events that correlated to sinus rhythm -- sometimes with PVCs --  and frequent artifact.  Unfortunately, her symptoms got much worse, and PVCs substantially more frequent during the few weeks following the monitor.  For the past few weeks, the PVCs have not been so bad.  Her metoprolol was increased to 50 mg twice daily, and now the PVCs are quite rare.  She denies chest pain, syncope, shortness of breath today. When the PVCs are very frequent -- several a minute -- she can feel lightheaded and short of breath.  Past Medical History:  Diagnosis Date   Anemia    Anxiety    Asthma    CAD (coronary artery disease)    Chest pain    Constipation    Depression    DM (diabetes mellitus) (HCC)    Estrogen deficiency    Fatty liver    GERD (gastroesophageal reflux disease)    HTN (hypertension)    Hypercholesteremia    Kidney problem    MI (myocardial infarction) (HCC)    Morbid obesity (HCC)    OSA (obstructive sleep apnea)    Palpitations    Vitamin D deficiency     Past Surgical History:  Procedure Laterality Date   left hip replacement      Current  Medications: Current Meds  Medication Sig   buPROPion (WELLBUTRIN SR) 200 MG 12 hr tablet Take 200 mg by mouth daily.   cholecalciferol (VITAMIN D3) 25 MCG (1000 UNIT) tablet Take 1,000 Units by mouth daily.   clopidogrel (PLAVIX) 75 MG tablet Take 1 tablet (75 mg total) by mouth daily.   escitalopram (LEXAPRO) 20 MG tablet Take 20 mg by mouth daily.   lansoprazole (PREVACID) 15 MG capsule Take 15 mg by mouth daily at 12 noon.   loratadine (CLARITIN) 10 MG tablet Take 10 mg by mouth daily.   metoprolol succinate (TOPROL-XL) 50 MG 24 hr tablet Take 1 tablet (50 mg total) by mouth daily.   mometasone (NASONEX) 50 MCG/ACT nasal spray as needed (allergies).   Multiple Vitamins-Minerals (MULTIPLE VITAMINS/WOMENS PO) Take by mouth daily.   olmesartan (BENICAR) 20 MG tablet Take 10 mg by mouth daily.   OZEMPIC, 1 MG/DOSE, 4 MG/3ML SOPN Inject into the skin once a week.   simvastatin (ZOCOR) 40 MG tablet Take 40 mg by mouth daily.   SYMBICORT 160-4.5 MCG/ACT inhaler Inhale into the lungs daily at 6 (six) AM.     Allergies:   Sulfa antibiotics   Social History   Socioeconomic History   Marital status: Divorced  Spouse name: Not on file   Number of children: Not on file   Years of education: Not on file   Highest education level: Not on file  Occupational History   Occupation: Retired Wellsite geologist  Tobacco Use   Smoking status: Never   Smokeless tobacco: Never  Substance and Sexual Activity   Alcohol use: Never   Drug use: Never   Sexual activity: Never  Other Topics Concern   Not on file  Social History Narrative   Not on file   Social Determinants of Health   Financial Resource Strain: Not on file  Food Insecurity: Not on file  Transportation Needs: Not on file  Physical Activity: Not on file  Stress: Not on file  Social Connections: Not on file     Family History: The patient's family history includes Cancer in her mother; Depression in her mother; Diabetes in her  father; Heart disease in her father; High Cholesterol in her mother; High blood pressure in her father and mother; Obesity in her father.  ROS:   Please see the history of present illness.    All other systems reviewed and are negative.  EKGs/Labs/Other Studies Reviewed Today:     I personally reviewed the ZioPatch monitor strips as documented in the HPI  EKG:  Last EKG results: Today - sinus rhythm no PVCs   Recent Labs: 05/10/2022: BUN 15; Creatinine, Ser 1.37; Hemoglobin 12.1; Platelets 280; Potassium 4.8; Sodium 139     Physical Exam:    VS:  BP 110/80   Pulse 70   Ht 5\' 4"  (1.626 m)   Wt 191 lb 3.2 oz (86.7 kg)   SpO2 97%   BMI 32.82 kg/m     Wt Readings from Last 3 Encounters:  05/31/22 191 lb 3.2 oz (86.7 kg)  05/20/22 188 lb 6.4 oz (85.5 kg)  05/10/22 179 lb 14.3 oz (81.6 kg)     GEN: Well nourished, well developed in no acute distress CARDIAC: RRR, no murmurs, rubs, gallops RESPIRATORY:  Normal work of breathing MUSCULOSKELETAL: no edema    ASSESSMENT & PLAN:    Frequent PVCs: she is very sensitive to her PVCs.  The most recent monitor showed a burden of less than 1%, but I suspect that at times her burden is higher than that.  I doubt that her burden is ever high enough to put her at risk for PVC induced cardiomyopathy.  Because she is responding to metoprolol, I would continue to treat her conservatively.  She will try magnesium taurate. If her symptoms worsen, I would recommend propranolol or atenolol.        Medication Adjustments/Labs and Tests Ordered: Current medicines are reviewed at length with the patient today.  Concerns regarding medicines are outlined above.  No orders of the defined types were placed in this encounter.  No orders of the defined types were placed in this encounter.    Signed, 05/12/22, MD  05/31/2022 3:10 PM    [facility]

## 2022-06-03 DIAGNOSIS — E6609 Other obesity due to excess calories: Secondary | ICD-10-CM | POA: Diagnosis not present

## 2022-06-03 DIAGNOSIS — E1165 Type 2 diabetes mellitus with hyperglycemia: Secondary | ICD-10-CM | POA: Diagnosis not present

## 2022-06-03 DIAGNOSIS — Z6832 Body mass index (BMI) 32.0-32.9, adult: Secondary | ICD-10-CM | POA: Diagnosis not present

## 2022-06-09 ENCOUNTER — Institutional Professional Consult (permissible substitution): Payer: Medicare PPO | Admitting: Cardiovascular Disease

## 2022-09-05 ENCOUNTER — Ambulatory Visit: Payer: Medicare PPO | Admitting: Internal Medicine

## 2022-09-06 ENCOUNTER — Encounter (HOSPITAL_BASED_OUTPATIENT_CLINIC_OR_DEPARTMENT_OTHER): Payer: Self-pay | Admitting: Student

## 2022-09-06 ENCOUNTER — Ambulatory Visit (INDEPENDENT_AMBULATORY_CARE_PROVIDER_SITE_OTHER): Payer: Medicare PPO | Admitting: Student

## 2022-09-06 ENCOUNTER — Other Ambulatory Visit (HOSPITAL_BASED_OUTPATIENT_CLINIC_OR_DEPARTMENT_OTHER): Payer: Self-pay

## 2022-09-06 ENCOUNTER — Ambulatory Visit (INDEPENDENT_AMBULATORY_CARE_PROVIDER_SITE_OTHER): Payer: Medicare PPO

## 2022-09-06 DIAGNOSIS — M5442 Lumbago with sciatica, left side: Secondary | ICD-10-CM

## 2022-09-06 DIAGNOSIS — M545 Low back pain, unspecified: Secondary | ICD-10-CM | POA: Diagnosis not present

## 2022-09-06 MED ORDER — METHYLPREDNISOLONE 4 MG PO TBPK
ORAL_TABLET | ORAL | 0 refills | Status: AC
  Filled 2022-09-06: qty 21, 6d supply, fill #0

## 2022-09-06 NOTE — Progress Notes (Signed)
Chief Complaint: Low back pain     History of Present Illness:    Rachel Ayers is a 67 y.o. female presenting for evaluation of severe left-sided low back pain that began 2 days ago.  She states that she had been doing a lot of activities in the seated position previously, but she does not know if this is what aggravated her symptoms.  She states that her pain is 10/10 today and has pain radiating down into the left leg.  She describes the pain as sharp but also that her entire left leg aches and feels weak.  She has tried a muscle relaxer that was of no benefit and has not taken any pain medication.  She is unable to walk more than a few steps due to pain.  She underwent a left total hip arthroplasty in 2018.  She is currently on Plavix, and cannot take any NSAIDs due to a kidney issue.  No incontinence or saddle anesthesia.   Surgical History:   Left total hip arthroplasty 2018  PMH/PSH/Family History/Social History/Meds/Allergies:    Past Medical History:  Diagnosis Date   Anemia    Anxiety    Asthma    CAD (coronary artery disease)    Chest pain    Constipation    Depression    DM (diabetes mellitus) (HCC)    Estrogen deficiency    Fatty liver    GERD (gastroesophageal reflux disease)    HTN (hypertension)    Hypercholesteremia    Kidney problem    MI (myocardial infarction) ([place])    Morbid obesity (HCC)    OSA (obstructive sleep apnea)    Palpitations    Vitamin D deficiency    Past Surgical History:  Procedure Laterality Date   left hip replacement     Social History   Socioeconomic History   Marital status: Divorced    Spouse name: Not on file   Number of children: Not on file   Years of education: Not on file   Highest education level: Not on file  Occupational History   Occupation: Retired Metallurgist  Tobacco Use   Smoking status: Never   Smokeless tobacco: Never  Substance and Sexual Activity   Alcohol use: Never    Drug use: Never   Sexual activity: Never  Other Topics Concern   Not on file  Social History Narrative   Not on file   Social Determinants of Health   Financial Resource Strain: Not on file  Food Insecurity: Not on file  Transportation Needs: Not on file  Physical Activity: Not on file  Stress: Not on file  Social Connections: Not on file   Family History  Problem Relation Age of Onset   High blood pressure Mother    High Cholesterol Mother    Cancer Mother    Depression Mother    Diabetes Father    High blood pressure Father    Heart disease Father    Obesity Father    Allergies  Allergen Reactions   Sulfa Antibiotics    Current Outpatient Medications  Medication Sig Dispense Refill   buPROPion (WELLBUTRIN SR) 200 MG 12 hr tablet Take 200 mg by mouth daily.     cholecalciferol (VITAMIN D3) 25 MCG (1000 UNIT) tablet Take 1,000 Units by mouth daily.  clopidogrel (PLAVIX) 75 MG tablet Take 1 tablet (75 mg total) by mouth daily. 90 tablet 3   escitalopram (LEXAPRO) 20 MG tablet Take 20 mg by mouth daily.     lansoprazole (PREVACID) 15 MG capsule Take 15 mg by mouth daily at 12 noon.     loratadine (CLARITIN) 10 MG tablet Take 10 mg by mouth daily.     methylPREDNISolone (MEDROL DOSEPAK) 4 MG TBPK tablet Take as directed on package. 21 each 0   metoprolol succinate (TOPROL-XL) 50 MG 24 hr tablet Take 1 tablet (50 mg total) by mouth daily. 90 tablet 3   mometasone (NASONEX) 50 MCG/ACT nasal spray as needed (allergies).     Multiple Vitamins-Minerals (MULTIPLE VITAMINS/WOMENS PO) Take by mouth daily.     olmesartan (BENICAR) 20 MG tablet Take 10 mg by mouth daily.     OZEMPIC, 1 MG/DOSE, 4 MG/3ML SOPN Inject into the skin once a week.     simvastatin (ZOCOR) 40 MG tablet Take 40 mg by mouth daily.     SYMBICORT 160-4.5 MCG/ACT inhaler Inhale into the lungs daily at 6 (six) AM.     No current facility-administered medications for this visit.   No results  found.  Review of Systems:   A ROS was performed including pertinent positives and negatives as documented in the HPI.  Physical Exam :   Constitutional: NAD and appears stated age Neurological: Alert and oriented Psych: Appropriate affect and cooperative There were no vitals taken for this visit.   Comprehensive Musculoskeletal Exam:    Tenderness to palpation around left lumbar spine and SI joint.  No tenderness over vertebral processes or lateral hip.  Pain elicited with weightbearing in the left lumbar region.  Patient ambulates with antalgic gait.  Strength testing with hip flexion limited by pain bilaterally.  Negative straight leg raise.  Negative Faber.  Strength 5/5 with knee flexion and extension as well as ankle plantar and dorsiflexion.  Distal pulses and sensation intact and equal bilaterally.  Imaging:   Xray (lumbar spine complete): Normal   I personally reviewed and interpreted the radiographs.   Assessment:   67 y.o. female presenting for evaluation of acute left-sided lower back pain.  Based on her history and exam I suspect her pain and radicular symptoms to be from a nerve impingement issue.  I recommend starting a steroid Dosepak for pain and inflammation control which she has agreed to.  We will plan to see her back in 3 weeks for reassessment.  At this point we can consider further assessment with an MRI and to see if patient would benefit from injections.  Could also recommend physical therapy if patient's symptoms are improving.  Patient aware of red flag symptoms and should these develop to seek emergent care.   Plan :    -Follow-up in 3 weeks to clinic    I personally saw and evaluated the patient, and participated in the management and treatment plan.  Marnee Spring, PA-C Orthopedics  This document was dictated using Systems analyst. A reasonable attempt at proof reading has been made to minimize errors.

## 2022-09-20 ENCOUNTER — Other Ambulatory Visit: Payer: Self-pay | Admitting: Family Medicine

## 2022-09-20 DIAGNOSIS — Z1231 Encounter for screening mammogram for malignant neoplasm of breast: Secondary | ICD-10-CM

## 2022-09-27 ENCOUNTER — Ambulatory Visit (HOSPITAL_BASED_OUTPATIENT_CLINIC_OR_DEPARTMENT_OTHER): Payer: Medicare PPO | Admitting: Student

## 2022-10-13 DIAGNOSIS — E1122 Type 2 diabetes mellitus with diabetic chronic kidney disease: Secondary | ICD-10-CM | POA: Diagnosis not present

## 2022-10-13 DIAGNOSIS — E039 Hypothyroidism, unspecified: Secondary | ICD-10-CM | POA: Diagnosis not present

## 2022-10-13 DIAGNOSIS — N1831 Chronic kidney disease, stage 3a: Secondary | ICD-10-CM | POA: Diagnosis not present

## 2022-10-13 DIAGNOSIS — E669 Obesity, unspecified: Secondary | ICD-10-CM | POA: Diagnosis not present

## 2022-10-13 DIAGNOSIS — I129 Hypertensive chronic kidney disease with stage 1 through stage 4 chronic kidney disease, or unspecified chronic kidney disease: Secondary | ICD-10-CM | POA: Diagnosis not present

## 2022-10-28 ENCOUNTER — Other Ambulatory Visit: Payer: Self-pay | Admitting: Internal Medicine

## 2022-11-03 ENCOUNTER — Ambulatory Visit
Admission: RE | Admit: 2022-11-03 | Discharge: 2022-11-03 | Disposition: A | Discharge status: Home / Self Care | Payer: Medicare PPO | Source: Ambulatory Visit | Attending: Family Medicine | Admitting: Family Medicine

## 2022-11-03 DIAGNOSIS — Z1231 Encounter for screening mammogram for malignant neoplasm of breast: Secondary | ICD-10-CM | POA: Diagnosis not present

## 2022-11-23 DIAGNOSIS — I471 Supraventricular tachycardia, unspecified: Secondary | ICD-10-CM | POA: Diagnosis not present

## 2022-11-23 DIAGNOSIS — N1831 Chronic kidney disease, stage 3a: Secondary | ICD-10-CM | POA: Diagnosis not present

## 2022-11-23 DIAGNOSIS — Z0001 Encounter for general adult medical examination with abnormal findings: Secondary | ICD-10-CM | POA: Diagnosis not present

## 2022-11-23 DIAGNOSIS — E038 Other specified hypothyroidism: Secondary | ICD-10-CM | POA: Diagnosis not present

## 2022-11-23 DIAGNOSIS — E7800 Pure hypercholesterolemia, unspecified: Secondary | ICD-10-CM | POA: Diagnosis not present

## 2022-11-23 DIAGNOSIS — F331 Major depressive disorder, recurrent, moderate: Secondary | ICD-10-CM | POA: Diagnosis not present

## 2022-11-23 DIAGNOSIS — E1122 Type 2 diabetes mellitus with diabetic chronic kidney disease: Secondary | ICD-10-CM | POA: Diagnosis not present

## 2022-11-23 DIAGNOSIS — M7660 Achilles tendinitis, unspecified leg: Secondary | ICD-10-CM | POA: Diagnosis not present

## 2022-12-08 DIAGNOSIS — L816 Other disorders of diminished melanin formation: Secondary | ICD-10-CM | POA: Diagnosis not present

## 2022-12-08 DIAGNOSIS — D229 Melanocytic nevi, unspecified: Secondary | ICD-10-CM | POA: Diagnosis not present

## 2022-12-08 DIAGNOSIS — L578 Other skin changes due to chronic exposure to nonionizing radiation: Secondary | ICD-10-CM | POA: Diagnosis not present

## 2022-12-08 DIAGNOSIS — D1801 Hemangioma of skin and subcutaneous tissue: Secondary | ICD-10-CM | POA: Diagnosis not present

## 2022-12-08 DIAGNOSIS — L821 Other seborrheic keratosis: Secondary | ICD-10-CM | POA: Diagnosis not present

## 2022-12-08 DIAGNOSIS — L814 Other melanin hyperpigmentation: Secondary | ICD-10-CM | POA: Diagnosis not present

## 2022-12-08 DIAGNOSIS — L818 Other specified disorders of pigmentation: Secondary | ICD-10-CM | POA: Diagnosis not present

## 2022-12-08 DIAGNOSIS — Z85828 Personal history of other malignant neoplasm of skin: Secondary | ICD-10-CM | POA: Diagnosis not present

## 2022-12-27 DIAGNOSIS — M7661 Achilles tendinitis, right leg: Secondary | ICD-10-CM | POA: Diagnosis not present

## 2022-12-27 DIAGNOSIS — N6321 Unspecified lump in the left breast, upper outer quadrant: Secondary | ICD-10-CM | POA: Diagnosis not present

## 2022-12-27 DIAGNOSIS — M7662 Achilles tendinitis, left leg: Secondary | ICD-10-CM | POA: Diagnosis not present

## 2022-12-28 ENCOUNTER — Other Ambulatory Visit: Payer: Self-pay | Admitting: Family Medicine

## 2022-12-28 DIAGNOSIS — N6320 Unspecified lump in the left breast, unspecified quadrant: Secondary | ICD-10-CM

## 2023-01-03 ENCOUNTER — Ambulatory Visit
Admission: RE | Admit: 2023-01-03 | Discharge: 2023-01-03 | Disposition: A | Discharge status: Home / Self Care | Payer: Medicare PPO | Source: Ambulatory Visit | Attending: Family Medicine | Admitting: Family Medicine

## 2023-01-03 ENCOUNTER — Other Ambulatory Visit: Payer: Self-pay | Admitting: Family Medicine

## 2023-01-03 DIAGNOSIS — N6320 Unspecified lump in the left breast, unspecified quadrant: Secondary | ICD-10-CM

## 2023-01-03 DIAGNOSIS — R928 Other abnormal and inconclusive findings on diagnostic imaging of breast: Secondary | ICD-10-CM | POA: Diagnosis not present

## 2023-01-04 ENCOUNTER — Other Ambulatory Visit: Payer: Self-pay | Admitting: Family Medicine

## 2023-01-04 DIAGNOSIS — N6320 Unspecified lump in the left breast, unspecified quadrant: Secondary | ICD-10-CM

## 2023-01-04 DIAGNOSIS — M7662 Achilles tendinitis, left leg: Secondary | ICD-10-CM | POA: Diagnosis not present

## 2023-01-04 DIAGNOSIS — M7661 Achilles tendinitis, right leg: Secondary | ICD-10-CM | POA: Diagnosis not present

## 2023-01-05 ENCOUNTER — Ambulatory Visit
Admission: RE | Admit: 2023-01-05 | Discharge: 2023-01-05 | Disposition: A | Discharge status: Home / Self Care | Payer: Medicare PPO | Source: Ambulatory Visit | Attending: Family Medicine | Admitting: Family Medicine

## 2023-01-05 ENCOUNTER — Other Ambulatory Visit: Payer: Medicare PPO

## 2023-01-05 DIAGNOSIS — N6320 Unspecified lump in the left breast, unspecified quadrant: Secondary | ICD-10-CM

## 2023-01-05 DIAGNOSIS — N6032 Fibrosclerosis of left breast: Secondary | ICD-10-CM | POA: Diagnosis not present

## 2023-01-05 DIAGNOSIS — N6322 Unspecified lump in the left breast, upper inner quadrant: Secondary | ICD-10-CM | POA: Diagnosis not present

## 2023-01-05 HISTORY — PX: BREAST BIOPSY: SHX20

## 2023-01-30 DIAGNOSIS — U071 COVID-19: Secondary | ICD-10-CM | POA: Diagnosis not present

## 2023-01-30 DIAGNOSIS — E1122 Type 2 diabetes mellitus with diabetic chronic kidney disease: Secondary | ICD-10-CM | POA: Diagnosis not present

## 2023-01-30 DIAGNOSIS — N1831 Chronic kidney disease, stage 3a: Secondary | ICD-10-CM | POA: Diagnosis not present

## 2023-02-08 DIAGNOSIS — U071 COVID-19: Secondary | ICD-10-CM | POA: Diagnosis not present

## 2023-02-08 DIAGNOSIS — J40 Bronchitis, not specified as acute or chronic: Secondary | ICD-10-CM | POA: Diagnosis not present

## 2023-02-08 DIAGNOSIS — E1122 Type 2 diabetes mellitus with diabetic chronic kidney disease: Secondary | ICD-10-CM | POA: Diagnosis not present

## 2023-03-01 DIAGNOSIS — N1831 Chronic kidney disease, stage 3a: Secondary | ICD-10-CM | POA: Diagnosis not present

## 2023-03-01 DIAGNOSIS — J189 Pneumonia, unspecified organism: Secondary | ICD-10-CM | POA: Diagnosis not present

## 2023-03-01 DIAGNOSIS — K219 Gastro-esophageal reflux disease without esophagitis: Secondary | ICD-10-CM | POA: Diagnosis not present

## 2023-05-15 DIAGNOSIS — H2513 Age-related nuclear cataract, bilateral: Secondary | ICD-10-CM | POA: Diagnosis not present

## 2023-05-15 DIAGNOSIS — H52203 Unspecified astigmatism, bilateral: Secondary | ICD-10-CM | POA: Diagnosis not present

## 2023-05-15 DIAGNOSIS — E119 Type 2 diabetes mellitus without complications: Secondary | ICD-10-CM | POA: Diagnosis not present

## 2023-05-15 DIAGNOSIS — H5203 Hypermetropia, bilateral: Secondary | ICD-10-CM | POA: Diagnosis not present

## 2023-05-31 DIAGNOSIS — N1831 Chronic kidney disease, stage 3a: Secondary | ICD-10-CM | POA: Diagnosis not present

## 2023-05-31 DIAGNOSIS — I471 Supraventricular tachycardia, unspecified: Secondary | ICD-10-CM | POA: Diagnosis not present

## 2023-05-31 DIAGNOSIS — K219 Gastro-esophageal reflux disease without esophagitis: Secondary | ICD-10-CM | POA: Diagnosis not present

## 2023-05-31 DIAGNOSIS — Z23 Encounter for immunization: Secondary | ICD-10-CM | POA: Diagnosis not present

## 2023-05-31 DIAGNOSIS — E1122 Type 2 diabetes mellitus with diabetic chronic kidney disease: Secondary | ICD-10-CM | POA: Diagnosis not present

## 2023-06-27 ENCOUNTER — Other Ambulatory Visit: Payer: Self-pay | Admitting: Family Medicine

## 2023-06-27 DIAGNOSIS — N649 Disorder of breast, unspecified: Secondary | ICD-10-CM

## 2023-07-13 ENCOUNTER — Ambulatory Visit
Admission: RE | Admit: 2023-07-13 | Discharge: 2023-07-13 | Disposition: A | Discharge status: Home / Self Care | Payer: Medicare PPO | Source: Ambulatory Visit | Attending: Family Medicine | Admitting: Family Medicine

## 2023-07-13 DIAGNOSIS — R928 Other abnormal and inconclusive findings on diagnostic imaging of breast: Secondary | ICD-10-CM | POA: Diagnosis not present

## 2023-07-13 DIAGNOSIS — N649 Disorder of breast, unspecified: Secondary | ICD-10-CM

## 2023-07-13 DIAGNOSIS — R921 Mammographic calcification found on diagnostic imaging of breast: Secondary | ICD-10-CM | POA: Diagnosis not present

## 2023-07-14 ENCOUNTER — Other Ambulatory Visit: Payer: Self-pay | Admitting: Family Medicine

## 2023-07-14 DIAGNOSIS — R921 Mammographic calcification found on diagnostic imaging of breast: Secondary | ICD-10-CM

## 2023-07-14 DIAGNOSIS — N6489 Other specified disorders of breast: Secondary | ICD-10-CM

## 2023-07-24 ENCOUNTER — Telehealth: Payer: Self-pay | Admitting: Internal Medicine

## 2023-07-24 NOTE — Telephone Encounter (Signed)
 Spoke with Rachel Ayers who complains of episode of increased palpitations yesterday and this morning.  Rachel Ayers states she did take an extra 1/2 tablet(25mg ) of Metoprolol  50mg  last night and has had her regular dose this morning.  Current HR 77.  Rachel Ayers does not have a way to check BP.  She denies current CP, SOB or dizziness.  Rachel Ayers states she does have one cup of coffee with caffeine daily and does eat chocolate.  She has also been under more stress lately.  Rachel Ayers has been scheduled to see Lawana Pray, on 07/26/2023.  Rachel Ayers advised to cut out all caffeine.  Continue current medications and plan to keep scheduled appointment as she has not been seen in [facility] since 12/23.Aaron Aas  Reviewed ED precautions.  Rachel Ayers verbalizes understanding and agrees with current plan.  Will forward to Dr Paulita Boss and Dr Arlester Ladd to make them aware.

## 2023-07-24 NOTE — Telephone Encounter (Signed)
   Pt c/o of Chest Pain: STAT if active CP, including tightness, pressure, jaw pain, radiating pain to shoulder/upper arm/back, CP unrelieved by Nitro. Symptoms reported of SOB, nausea, vomiting, sweating.  1. Are you having CP right now? Not having ches pains at this time, she is having PVC's    2. Are you experiencing any other symptoms (ex. SOB, nausea, vomiting, sweating)? Short of breath sometimes   3. Is your CP continuous or coming and going? PVC just all the time, chest pains comes and goes   4. Have you taken Nitroglycerin? Do not have any   5. How long have you been experiencing CP?  Started yesterday morning - patient wanted to be seen- I made her appointment with Aleta Anda on Wednesday([phone] pleasecall to evaluate   6. If NO CP at time of call then end call with telling Pt to call back or call 911 if Chest pain returns prior to return call from triage team.

## 2023-07-25 NOTE — Progress Notes (Unsigned)
Cardiology Clinic Note   Patient Name: Rachel Ayers Date of Encounter: 07/26/2023  Primary Care Provider:  Darrow Bussing, MD Primary Cardiologist:  Christell Constant, MD  Patient Profile    Rachel Ayers 68 year old female presents to the clinic today for follow-up evaluation of her PVCs, coronary disease, and hypertension.  Past Medical History    Past Medical History:  Diagnosis Date   Anemia    Anxiety    Asthma    CAD (coronary artery disease)    Chest pain    Constipation    Depression    DM (diabetes mellitus) (HCC)    Estrogen deficiency    Fatty liver    GERD (gastroesophageal reflux disease)    HTN (hypertension)    Hypercholesteremia    Kidney problem    MI (myocardial infarction) (HCC)    Morbid obesity (HCC)    OSA (obstructive sleep apnea)    Palpitations    Vitamin D deficiency    Past Surgical History:  Procedure Laterality Date   BREAST BIOPSY Left 01/05/2023   [place] LT BREAST BX W LOC DEV 1ST LESION IMG BX SPEC US GUIDE 01/05/2023 [facility]   BREAST EXCISIONAL BIOPSY     left hip replacement      Allergies  Allergies  Allergen Reactions   Sulfa Antibiotics     History of Present Illness    Rachel Ayers is a PMH of PACs, PVCs, SVT, anxiety, asthma, coronary artery disease, hypertension, hyperlipidemia, and GERD.  Echocardiogram 2021 showed questionable RV dysfunction.  She underwent CMR which showed no RV dysfunction.  She was noted to have some evidence of atrial septal aneurysm without evidence of PFO.  She followed up with Dr. Izora Ribas 11-23.  During that time she was doing okay.  She was having more frequent PVCs.  When she noticed them she was having chest pain and noted to have pallor.  She denied chest pain and pressure.  She denied shortness of breath and DOE.  She denied weight gain and lower extremity swelling.  She reported that the prior week she was having low blood pressures.  She had just been on a trip to  [place] and had no symptoms.  She was seen by Dr.Varanasi on 05/20/2022.  During that time she was being seen for DOD visit.  She had worn a cardiac event monitor 11/23 which showed minimum heart rate of 56, maximum heart rate of 160 and an average heart rate of 73 bpm.  She had predominantly normal sinus rhythm.  She was noted to have small paroxysmal's of SVT that were symptomatic, isolated PACs, isolated PVCs, and her triggered events were associated with sinus rhythm, PACs, PVCs, and SVT.  Her episodes during SVT were mild.  Her symptoms increase dramatically immediately after turning in her monitor.  Her metoprolol was increased to 50 mg at that time.  She did note some improvement in her symptoms but also noted fatigue.  Her symptoms were not completely gone.  She reported that she was very inactive after increasing medication.  Options for antiarrhythmic therapy and metoprolol therapy or reviewed.  She also noted some dizziness.  She was instructed to increase her p.o. hydration.  Her blood pressure was noted to be 148/74.  She reported blood pressures in the 120 systolic at home.  She was referred to electrophysiology.  She was seen by Dr. Nelly Laurence on 05/31/2022.  During that time she reported that her PVCs leading up to  her appointment after medication change were not as bad.  She reported PVCs being fairly rare.  She denied chest pain, syncope, shortness of breath.  She did note that when her PVCs were very frequent she would note lightheadedness and shortness of breath.  She was not felt to be at risk for PVC induced cardiomyopathy.  She was felt to be responding to metoprolol.  Conservative management was recommended.  Magnesium supplementation was recommended.  She presents to the clinic today for follow-up evaluation and states on Sunday around 10 AM she noticed increased palpitations.  These continued for quite some time and she noticed chest pain and some shortness of breath as well as chest  tightness.  She does note that she was at a birthday party and had some chocolate at that time.  She indicates that she took an extra half dose of her metoprolol and then about an hour later took Xanax.  Her heart rate then returned to normal.  We reviewed triggers for palpitations.  She reports hydrating well.  We reviewed her EKG today which shows sinus rhythm 83 bpm.  I will order a CBC, BMP, and magnesium.  I encouraged her to take an extra half dose of metoprolol for sustained palpitations and continue to hydrate well.  She expressed understanding.  I will plan follow-up in 3 months.  Today she denies chest pain, shortness of breath, lower extremity edema, fatigue, palpitations, melena, hematuria, hemoptysis, diaphoresis, weakness, presyncope, syncope, orthopnea, and PND.     Home Medications    Prior to Admission medications   Medication Sig Start Date End Date Taking? Authorizing Provider  buPROPion (WELLBUTRIN SR) 200 MG 12 hr tablet Take 200 mg by mouth daily.    [provider]  cholecalciferol (VITAMIN D3) 25 MCG (1000 UNIT) tablet Take 1,000 Units by mouth daily.    [provider]  clopidogrel (PLAVIX) 75 MG tablet TAKE 1 TABLET BY MOUTH EVERY DAY 10/28/22   Chandrasekhar, Mahesh A, MD  escitalopram (LEXAPRO) 20 MG tablet Take 20 mg by mouth daily.    [provider]  lansoprazole (PREVACID) 15 MG capsule Take 15 mg by mouth daily at 12 noon.    [provider]  loratadine (CLARITIN) 10 MG tablet Take 10 mg by mouth daily.    [provider]  methylPREDNISolone (MEDROL DOSEPAK) 4 MG TBPK tablet Take as directed on package. 09/06/22   Amador Cunas, PA-C  metoprolol succinate (TOPROL-XL) 50 MG 24 hr tablet Take 1 tablet (50 mg total) by mouth daily. 05/30/22   Chandrasekhar, Mahesh A, MD  mometasone (NASONEX) 50 MCG/ACT nasal spray as needed (allergies).    [provider]  Multiple Vitamins-Minerals (MULTIPLE VITAMINS/WOMENS  PO) Take by mouth daily.    [provider]  olmesartan (BENICAR) 20 MG tablet Take 10 mg by mouth daily. 07/16/21   [provider]  OZEMPIC, 1 MG/DOSE, 4 MG/3ML SOPN Inject into the skin once a week. 07/08/21   [provider]  simvastatin (ZOCOR) 40 MG tablet Take 40 mg by mouth daily.    [provider]  SYMBICORT 160-4.5 MCG/ACT inhaler Inhale into the lungs daily at 6 (six) AM. 05/13/21   [provider]    Family History    Family History  Problem Relation Age of Onset   Breast cancer Mother    High blood pressure Mother    High Cholesterol Mother    Cancer Mother    Depression Mother    Diabetes  Father    High blood pressure Father    Heart disease Father    Obesity Father    She indicated that the status of her mother is unknown. She indicated that the status of her father is unknown.  Social History    Social History   Socioeconomic History   Marital status: Divorced    Spouse name: Not on file   Number of children: Not on file   Years of education: Not on file   Highest education level: Not on file  Occupational History   Occupation: Retired Wellsite geologist  Tobacco Use   Smoking status: Never   Smokeless tobacco: Never  Substance and Sexual Activity   Alcohol use: Never   Drug use: Never   Sexual activity: Never  Other Topics Concern   Not on file  Social History Narrative   Not on file   Social Drivers of Health   Financial Resource Strain: Not on file  Food Insecurity: Not on file  Transportation Needs: Not on file  Physical Activity: Not on file  Stress: Not on file  Social Connections: Not on file  Intimate Partner Violence: Not on file     Review of Systems    General:  No chills, fever, night sweats or weight changes.  Cardiovascular:  No chest pain, dyspnea on exertion, edema, orthopnea, palpitations, paroxysmal nocturnal dyspnea. Dermatological: No rash, lesions/masses Respiratory: No cough,  dyspnea Urologic: No hematuria, dysuria Abdominal:   No nausea, vomiting, diarrhea, bright red blood per rectum, melena, or hematemesis Neurologic:  No visual changes, wkns, changes in mental status. All other systems reviewed and are otherwise negative except as noted above.  Physical Exam    VS:  BP 132/78 (BP Location: Left Arm, Patient Position: Sitting, Cuff Size: Normal)   Pulse 88   Ht 5\' 4"  (1.626 m)   Wt 204 lb 12.8 oz (92.9 kg)   SpO2 95%   BMI 35.15 kg/m  , BMI Body mass index is 35.15 kg/m. GEN: Well nourished, well developed, in no acute distress. HEENT: normal. Neck: Supple, no JVD, carotid bruits, or masses. Cardiac: RRR, no murmurs, rubs, or gallops. No clubbing, cyanosis, edema.  Radials/DP/PT 2+ and equal bilaterally.  Respiratory:  Respirations regular and unlabored, clear to auscultation bilaterally. GI: Soft, nontender, nondistended, BS + x 4. MS: no deformity or atrophy. Skin: warm and dry, no rash. Neuro:  Strength and sensation are intact. Psych: Normal affect.  Accessory Clinical Findings    Recent Labs: No results found for requested labs within last 365 days.   Recent Lipid Panel    Component Value Date/Time   CHOL 184 02/22/2021 0000   TRIG 161 (H) 02/22/2021 0000   HDL 50 02/22/2021 0000   LDLCALC 106 (H) 02/22/2021 0000         ECG personally reviewed by me today- EKG Interpretation Date/Time:  Wednesday July 26 2023 08:49:54 EST Ventricular Rate:  83 PR Interval:  170 QRS Duration:  104 QT Interval:  392 QTC Calculation: 460 R Axis:   -56  Text Interpretation: Normal sinus rhythm Left anterior fascicular block Left ventricular hypertrophy ( R in aVL , Cornell product , Romhilt-Estes ) Confirmed by Edd Fabian [phone]) on 07/26/2023 8:51:16 AM   Left/Right Heart Catheterizations: Date: 03/22/09 Results: OSH LCP:03/22/09- EF 40% LVEDP 18, Nondominant Circ; Large RCA (dominant vessel) widely patent coronary arteries   ECHO  COMPLETE WITH IMAGING ENHANCING AGENT 03/09/2020   Narrative ECHOCARDIOGRAM REPORT  Patient Name:   Rachel Ayers Date of Exam: 03/09/2020 Medical Rec #:  [id number]           Height:       64.0 in Accession #:    [id number]          Weight:       192.8 lb Date of Birth:  November 12, 1955           BSA:          1.926 m Patient Age:    64 years            BP:           130/80 mmHg Patient Gender: F                   HR:           69 bpm. Exam Location:  [address]   Procedure: 2D Echo, Cardiac Doppler, Color Doppler and Intracardiac Opacification Agent   Indications:    I42.9 Cardiomyopathy (unspecified); I51.81 Takotsubo syndrome   History:        Patient has no prior history of Echocardiogram examinations. Previous Myocardial Infarction and CAD, Arrythmias:PVC and PAC, Signs/Symptoms:Chest Pain; Risk Factors:Diabetes. Obesity. Obstructive sleep apnea.   Sonographer:    Cathie Beams RCS Referring Phys: [id number] [facility] A CHANDRASEKHAR   IMPRESSIONS     1. Left ventricular ejection fraction, by estimation, is 65 to 70%. The left ventricle has normal function. The left ventricle has no regional wall motion abnormalities. There is mild left ventricular hypertrophy. Left ventricular diastolic parameters are indeterminate. 2. Endocardiurm of RV very difficult to see. Definity did not help. RV function appears reduced . Right ventricular systolic function was not well visualized. The right ventricular size is not well visualized. 3. The mitral valve is normal in structure. Trivial mitral valve regurgitation. 4. The aortic valve is abnormal. Aortic valve regurgitation is not visualized. Mild aortic valve sclerosis is present, with no evidence of aortic valve stenosis. 5. The inferior vena cava is normal in size with greater than 50% respiratory variability, suggesting right atrial pressure of 3 mmHg.   FINDINGS Left Ventricle: Left ventricular ejection fraction, by estimation,  is 65 to 70%. The left ventricle has normal function. The left ventricle has no regional wall motion abnormalities. The left ventricular internal cavity size was normal in size. There is mild left ventricular hypertrophy. Left ventricular diastolic parameters are indeterminate.   Right Ventricle: Endocardiurm of RV very difficult to see. Definity did not help. RV function appears reduced. The right ventricular size is not well visualized. No increase in right ventricular wall thickness. Right ventricular systolic function was not well visualized.   Left Atrium: Left atrial size was normal in size.   Right Atrium: Right atrial size was normal in size.   Pericardium: There is no evidence of pericardial effusion.   Mitral Valve: The mitral valve is normal in structure. Trivial mitral valve regurgitation.   Tricuspid Valve: The tricuspid valve is normal in structure. Tricuspid valve regurgitation is trivial.   Aortic Valve: The aortic valve is abnormal. Aortic valve regurgitation is not visualized. Mild aortic valve sclerosis is present, with no evidence of aortic valve stenosis.   Pulmonic Valve: The pulmonic valve was normal in structure. Pulmonic valve regurgitation is trivial.   Aorta: The aortic root and ascending aorta are structurally normal, with no evidence of dilitation.   Venous: The inferior vena cava is normal in size with greater than  50% respiratory variability, suggesting right atrial pressure of 3 mmHg.   IAS/Shunts: No atrial level shunt detected by color flow Doppler.    Cardiac MRI 03/31/2020  FINDINGS: 1. Normal left ventricular size, LVEDVI 38 ml/m2. Mild, concentric LV thickness with normal LV Mass (Myo mass/BSA 59 g/m2). Hyperdynamic LV systolic function (LVEF =74%). There are no regional wall motion abnormalities.   There is no late gadolinium enhancement in the left ventricular myocardium.   2. Normal right ventricular size and thickness, RVEDVI 46  ml/m2. Normal RV systolic function (RVEF =53%). There are no regional wall motion abnormalities or aneurysms. No criteria for ARVC met.   3. Normal left and right atrial size. Evidence of atrial septal aneurysm.   4. Normal size of the aortic root, ascending aorta and pulmonary artery.   5.  No significant valvular abnormalities.   6.  Normal pericardium.  No pericardial effusion.   IMPRESSION: Normal biventricular function without evidence of late gadolinium enhancement.   No criteria for ARVC met.   Mahesh  Chandrasekar     Electronically Signed   By: Marlan Palau MD   On: 03/31/2020 19:01     Assessment & Plan   1.  Palpitations, PVCs-EKG today shows normal sinus rhythm left anterior fascicular block 83 bpm. Continue metoprolol Avoid triggers caffeine, chocolate, EtOH, dehydration etc. Increase physical activity as tolerated-encouraged low intensity aerobic type activities-goal 150 minutes of moderate physical activity per week.  Takotsubo cardiomyopathy, atrial septal aneurysm-no increased DOE or activity intolerance.  Cardiac MRI showed no RV dysfunction and some evidence of atrial septal aneurysm without evidence of PFO. Increase physical activity as tolerated Heart healthy low-sodium diet Repeat echocardiogram when clinically indicated  Essential hypertension-BP today 132/72. Maintain blood pressure log Low-sodium diet Continue olmesartan, metoprolol  Hyperlipidemia-LDL 117 on 11/23/2022 High-fiber diet Continue Plavix, simvastatin   Disposition: Follow-up with Dr. Izora Ribas or me in 3 months.   Thomasene Ripple. Tegh Franek NP-C     07/26/2023, 9:08 AM [facility] [address] Office [phone] Fax [phone]    I spent 14 minutes examining this patient, reviewing medications, and using patient centered shared decision making involving their cardiac care.   I spent  20 minutes reviewing past medical  history,  medications, and prior cardiac tests.

## 2023-07-26 ENCOUNTER — Ambulatory Visit: Payer: Medicare PPO | Attending: General Practice | Admitting: General Practice

## 2023-07-26 ENCOUNTER — Encounter: Payer: Self-pay | Admitting: General Practice

## 2023-07-26 VITALS — BP 132/78 | HR 88 | Ht 64.0 in | Wt 204.8 lb

## 2023-07-26 DIAGNOSIS — R030 Elevated blood-pressure reading, without diagnosis of hypertension: Secondary | ICD-10-CM

## 2023-07-26 DIAGNOSIS — I471 Supraventricular tachycardia, unspecified: Secondary | ICD-10-CM | POA: Diagnosis not present

## 2023-07-26 DIAGNOSIS — I493 Ventricular premature depolarization: Secondary | ICD-10-CM

## 2023-07-26 DIAGNOSIS — I5181 Takotsubo syndrome: Secondary | ICD-10-CM

## 2023-07-26 DIAGNOSIS — E782 Mixed hyperlipidemia: Secondary | ICD-10-CM | POA: Diagnosis not present

## 2023-07-26 LAB — CBC

## 2023-07-26 NOTE — Patient Instructions (Signed)
Medication Instructions:  MAY TAKE EXTRA 1/2 METOPROLOL FOR SUSTAINED PALPITATION *If you need a refill on your cardiac medications before your next appointment, please call your pharmacy*  Lab Work: CBC, BMET AND MAG TODAY If you have labs (blood work) drawn today and your tests are completely normal, you will receive your results only by:  MyChart Message (if you have MyChart) OR A paper copy in the mail If you have any lab test that is abnormal or we need to change your treatment, we will call you to review the results.  Other Instructions  Please try to avoid these triggers: Do not use any products that have nicotine or tobacco in them. These include cigarettes, e-cigarettes, and chewing tobacco. If you need help quitting, ask your doctor. Eat heart-healthy foods. Talk with your doctor about the right eating plan for you. Exercise regularly as told by your doctor. Stay hydrated Do not drink alcohol, Caffeine or chocolate. Lose weight if you are overweight. Do not use drugs, including cannabis  Follow-Up: At [facility], you and your health needs are our priority.  As part of our continuing mission to provide you with exceptional heart care, we have created designated Provider Care Teams.  These Care Teams include your primary Cardiologist (physician) and Advanced Practice Providers (APPs -  Physician Assistants and Nurse Practitioners) who all work together to provide you with the care you need, when you need it.  Your next appointment:   3 month(s)  Provider:   Christell Constant, MD  or Edd Fabian, FNP

## 2023-07-27 LAB — CBC
Hematocrit: 40.1 % (ref 34.0–46.6)
Hemoglobin: 13.1 g/dL (ref 11.1–15.9)
MCH: 30.8 pg (ref 26.6–33.0)
MCHC: 32.7 g/dL (ref 31.5–35.7)
MCV: 94 fL (ref 79–97)
Platelets: 212 10*3/uL (ref 150–450)
RBC: 4.25 x10E6/uL (ref 3.77–5.28)
RDW: 12.1 % (ref 11.7–15.4)
WBC: 6.6 10*3/uL (ref 3.4–10.8)

## 2023-07-27 LAB — BASIC METABOLIC PANEL
BUN/Creatinine Ratio: 10 — ABNORMAL LOW (ref 12–28)
BUN: 12 mg/dL (ref 8–27)
CO2: 25 mmol/L (ref 20–29)
Calcium: 9.8 mg/dL (ref 8.7–10.3)
Chloride: 99 mmol/L (ref 96–106)
Creatinine, Ser: 1.17 mg/dL — ABNORMAL HIGH (ref 0.57–1.00)
Glucose: 134 mg/dL — ABNORMAL HIGH (ref 70–99)
Potassium: 4.9 mmol/L (ref 3.5–5.2)
Sodium: 141 mmol/L (ref 134–144)
eGFR: 51 mL/min/{1.73_m2} — ABNORMAL LOW (ref >59)

## 2023-07-27 LAB — MAGNESIUM: Magnesium: 2.1 mg/dL (ref 1.6–2.3)

## 2023-08-04 DIAGNOSIS — N1831 Chronic kidney disease, stage 3a: Secondary | ICD-10-CM | POA: Diagnosis not present

## 2023-08-04 DIAGNOSIS — E1122 Type 2 diabetes mellitus with diabetic chronic kidney disease: Secondary | ICD-10-CM | POA: Diagnosis not present

## 2023-08-10 ENCOUNTER — Other Ambulatory Visit: Payer: Self-pay | Admitting: Internal Medicine

## 2023-10-10 DIAGNOSIS — I471 Supraventricular tachycardia, unspecified: Secondary | ICD-10-CM | POA: Diagnosis not present

## 2023-10-10 DIAGNOSIS — F331 Major depressive disorder, recurrent, moderate: Secondary | ICD-10-CM | POA: Diagnosis not present

## 2023-10-10 DIAGNOSIS — E7800 Pure hypercholesterolemia, unspecified: Secondary | ICD-10-CM | POA: Diagnosis not present

## 2023-10-10 DIAGNOSIS — E1122 Type 2 diabetes mellitus with diabetic chronic kidney disease: Secondary | ICD-10-CM | POA: Diagnosis not present

## 2023-10-10 DIAGNOSIS — N1831 Chronic kidney disease, stage 3a: Secondary | ICD-10-CM | POA: Diagnosis not present

## 2023-10-12 ENCOUNTER — Ambulatory Visit: Payer: Medicare PPO | Admitting: General Practice

## 2023-11-27 DIAGNOSIS — I25118 Atherosclerotic heart disease of native coronary artery with other forms of angina pectoris: Secondary | ICD-10-CM | POA: Diagnosis not present

## 2023-11-27 DIAGNOSIS — N1831 Chronic kidney disease, stage 3a: Secondary | ICD-10-CM | POA: Diagnosis not present

## 2023-11-27 DIAGNOSIS — F331 Major depressive disorder, recurrent, moderate: Secondary | ICD-10-CM | POA: Diagnosis not present

## 2023-11-27 DIAGNOSIS — E038 Other specified hypothyroidism: Secondary | ICD-10-CM | POA: Diagnosis not present

## 2023-11-27 DIAGNOSIS — E7800 Pure hypercholesterolemia, unspecified: Secondary | ICD-10-CM | POA: Diagnosis not present

## 2023-11-27 DIAGNOSIS — Z0000 Encounter for general adult medical examination without abnormal findings: Secondary | ICD-10-CM | POA: Diagnosis not present

## 2023-11-27 DIAGNOSIS — Z0001 Encounter for general adult medical examination with abnormal findings: Secondary | ICD-10-CM | POA: Diagnosis not present

## 2023-11-27 DIAGNOSIS — I471 Supraventricular tachycardia, unspecified: Secondary | ICD-10-CM | POA: Diagnosis not present

## 2023-11-27 DIAGNOSIS — E1122 Type 2 diabetes mellitus with diabetic chronic kidney disease: Secondary | ICD-10-CM | POA: Diagnosis not present

## 2023-11-28 ENCOUNTER — Other Ambulatory Visit (HOSPITAL_BASED_OUTPATIENT_CLINIC_OR_DEPARTMENT_OTHER): Payer: Self-pay | Admitting: Family Medicine

## 2023-11-28 DIAGNOSIS — E2839 Other primary ovarian failure: Secondary | ICD-10-CM

## 2023-12-19 DIAGNOSIS — L821 Other seborrheic keratosis: Secondary | ICD-10-CM | POA: Diagnosis not present

## 2023-12-19 DIAGNOSIS — D229 Melanocytic nevi, unspecified: Secondary | ICD-10-CM | POA: Diagnosis not present

## 2023-12-19 DIAGNOSIS — Z85828 Personal history of other malignant neoplasm of skin: Secondary | ICD-10-CM | POA: Diagnosis not present

## 2023-12-19 DIAGNOSIS — L814 Other melanin hyperpigmentation: Secondary | ICD-10-CM | POA: Diagnosis not present

## 2023-12-19 DIAGNOSIS — L304 Erythema intertrigo: Secondary | ICD-10-CM | POA: Diagnosis not present

## 2023-12-19 DIAGNOSIS — D1801 Hemangioma of skin and subcutaneous tissue: Secondary | ICD-10-CM | POA: Diagnosis not present

## 2023-12-19 DIAGNOSIS — L578 Other skin changes due to chronic exposure to nonionizing radiation: Secondary | ICD-10-CM | POA: Diagnosis not present

## 2023-12-19 DIAGNOSIS — L570 Actinic keratosis: Secondary | ICD-10-CM | POA: Diagnosis not present

## 2024-01-02 ENCOUNTER — Ambulatory Visit: Admitting: Podiatry

## 2024-01-10 ENCOUNTER — Ambulatory Visit
Admission: RE | Admit: 2024-01-10 | Discharge: 2024-01-10 | Disposition: A | Discharge status: Home / Self Care | Source: Ambulatory Visit | Attending: Family Medicine | Admitting: Family Medicine

## 2024-01-10 DIAGNOSIS — N6489 Other specified disorders of breast: Secondary | ICD-10-CM

## 2024-01-10 DIAGNOSIS — R928 Other abnormal and inconclusive findings on diagnostic imaging of breast: Secondary | ICD-10-CM | POA: Diagnosis not present

## 2024-01-10 DIAGNOSIS — D242 Benign neoplasm of left breast: Secondary | ICD-10-CM | POA: Diagnosis not present

## 2024-01-10 DIAGNOSIS — R92323 Mammographic fibroglandular density, bilateral breasts: Secondary | ICD-10-CM | POA: Diagnosis not present

## 2024-01-10 DIAGNOSIS — R921 Mammographic calcification found on diagnostic imaging of breast: Secondary | ICD-10-CM

## 2024-01-22 ENCOUNTER — Ambulatory Visit: Admitting: Podiatry

## 2024-01-22 VITALS — Ht 64.0 in | Wt 204.0 lb

## 2024-01-22 DIAGNOSIS — L600 Ingrowing nail: Secondary | ICD-10-CM

## 2024-01-22 NOTE — Progress Notes (Signed)
  Subjective:  Patient ID: Rachel Ayers, female    DOB: 06/11/1956,  MRN: [id number]  Chief Complaint  Patient presents with   Toe Pain    Rm 24 Patient is here for pain in the right hallux and index toe. Patient states possible ingrown toe nails after pedicure service.    68 y.o. female presents with the above complaint. History confirmed with patient.   Objective:  Physical Exam: warm, good capillary refill, no trophic changes or ulcerative lesions, normal DP and PT pulses, normal sensory exam, and slight pain in the lateral borders of right hallux and second toe.  Assessment:   1. Ingrown nail      Plan:  Patient was evaluated and treated and all questions answered.  Discussed treatment of ingrowing nails including partial permanent matricectomy.  She preferred not to proceed with this today and will continue with soaking and pedicures.  I debrided the offending borders with a sharp nail nipper in a slant back fashion today which alleviated her discomfort.  She will follow-up as needed if it returns or worsens.  Return if symptoms worsen or fail to improve.

## 2024-01-22 NOTE — Patient Instructions (Signed)
 Use O'Keefe's foot cream on the nail corner to soften. [facility] nail spa is a good option for pedicures
